# Patient Record
Sex: Male | Born: 1990 | Race: White | Hispanic: No | Marital: Single | Smoking: Current every day smoker
Health system: Southern US, Community
[De-identification: ages and names within clinical notes are randomized; demographics above are authoritative.]

## PROBLEM LIST (undated history)

## (undated) DIAGNOSIS — F319 Bipolar disorder, unspecified: Secondary | ICD-10-CM

## (undated) DIAGNOSIS — F419 Anxiety disorder, unspecified: Secondary | ICD-10-CM

## (undated) DIAGNOSIS — F32A Depression, unspecified: Secondary | ICD-10-CM

## (undated) DIAGNOSIS — F988 Other specified behavioral and emotional disorders with onset usually occurring in childhood and adolescence: Secondary | ICD-10-CM

## (undated) HISTORY — DX: Bipolar disorder, unspecified: F31.9

## (undated) HISTORY — DX: Depression, unspecified: F32.A

## (undated) HISTORY — DX: Anxiety disorder, unspecified: F41.9

---
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
## 2010-06-20 ENCOUNTER — Emergency Department (HOSPITAL_COMMUNITY): Admission: EM | Admit: 2010-06-20 | Discharge: 2010-06-20 | Payer: Self-pay | Admitting: Emergency Medicine

## 2010-09-09 ENCOUNTER — Emergency Department (HOSPITAL_COMMUNITY): Admission: EM | Admit: 2010-09-09 | Discharge: 2010-09-09 | Payer: Self-pay | Admitting: Emergency Medicine

## 2010-12-16 NOTE — Progress Notes (Signed)
Summary: Rx Req  Phone Note Refill Request Call back at Home Phone [phone] Message from:  Patient  Refills Requested: Medication #1:  TETRACYCLINE HCL 250 MG  CAPS two times a day  Medication #2:  DIFFERIN 0.1 %  GEL apply at bedtime after wahing face Pt uses [facility]. Can we please let them know it has been called in.  Initial call taken by: Clydell Hakim,  November 17, 2009 9:32 AM  Follow-up for Phone Call        to pcp Follow-up by: Golden Circle RN,  November 17, 2009 10:07 AM    Prescriptions: DIFFERIN 0.1 %  GEL (ADAPALENE) apply at bedtime after wahing face, 60 gm tube  #1 x 1   Entered and Authorized by:   Helane Rima DO   Signed by:   Helane Rima DO on 11/17/2009   Method used:   Electronically to        [facility] (retail)       [address]       [place], [place]  [id number]       Ph: [phone]       Fax: [phone]   RxID:   [id number] TETRACYCLINE HCL 250 MG  CAPS (TETRACYCLINE HCL) two times a day  #60 x 0   Entered and Authorized by:   Helane Rima DO   Signed by:   Helane Rima DO on 11/17/2009   Method used:   Electronically to        [facility] (retail)       [address]       [place], [place]  [id number]       Ph: [phone]       Fax: [phone]   RxID:   [id number]

## 2010-12-16 NOTE — Progress Notes (Signed)
Summary: refill  Phone Note Refill Request Call back at Home Phone [phone] Message from:  Patient  Refills Requested: Medication #1:  TETRACYCLINE HCL 250 MG  CAPS two times a day  Medication #2:  DIFFERIN 0.1 %  GEL apply at bedtime after wahing face Initial call taken by: De Nurse,  December 23, 2009 12:14 PM  Follow-up for Phone Call        to pcp Follow-up by: Golden Circle RN,  December 23, 2009 12:15 PM    Prescriptions: DIFFERIN 0.1 %  GEL (ADAPALENE) apply at bedtime after wahing face, 60 gm tube  #1 x 1   Entered and Authorized by:   Helane Rima DO   Signed by:   Helane Rima DO on 12/23/2009   Method used:   Electronically to        [facility] (retail)       [address]       [place], [place]  [id number]       Ph: [phone]       Fax: [phone]   RxID:   [id number] TETRACYCLINE HCL 250 MG  CAPS (TETRACYCLINE HCL) two times a day  #60 x 0   Entered and Authorized by:   Helane Rima DO   Signed by:   Helane Rima DO on 12/23/2009   Method used:   Electronically to        [facility] (retail)       [address]       [place], [place]  [id number]       Ph: [phone]       Fax: [phone]   RxID:   [id number]

## 2010-12-21 ENCOUNTER — Encounter: Payer: Self-pay | Admitting: *Deleted

## 2012-03-17 ENCOUNTER — Emergency Department (HOSPITAL_COMMUNITY)
Admission: EM | Admit: 2012-03-17 | Discharge: 2012-03-17 | Disposition: A | Discharge status: Home / Self Care | Payer: Self-pay | Attending: Emergency Medicine | Admitting: Emergency Medicine

## 2012-03-17 ENCOUNTER — Encounter (HOSPITAL_COMMUNITY): Payer: Self-pay | Admitting: *Deleted

## 2012-03-17 DIAGNOSIS — R369 Urethral discharge, unspecified: Secondary | ICD-10-CM | POA: Insufficient documentation

## 2012-03-17 DIAGNOSIS — Z202 Contact with and (suspected) exposure to infections with a predominantly sexual mode of transmission: Secondary | ICD-10-CM | POA: Insufficient documentation

## 2012-03-17 MED ORDER — CEFTRIAXONE SODIUM 250 MG IJ SOLR
250.0000 mg | Freq: Once | INTRAMUSCULAR | Status: AC
  Administered 2012-03-17: 250 mg via INTRAMUSCULAR
  Filled 2012-03-17: qty 250

## 2012-03-17 MED ORDER — AZITHROMYCIN 250 MG PO TABS
1000.0000 mg | ORAL_TABLET | Freq: Once | ORAL | Status: AC
  Administered 2012-03-17: 1000 mg via ORAL
  Filled 2012-03-17: qty 4

## 2012-03-17 MED ORDER — LIDOCAINE HCL (PF) 1 % IJ SOLN
INTRAMUSCULAR | Status: AC
  Administered 2012-03-17: 5 mL
  Filled 2012-03-17: qty 5

## 2012-03-17 NOTE — ED Notes (Signed)
Here with concern for STD, was told he needs to be checked, wants to be checked.  Denies any sx.

## 2012-03-17 NOTE — ED Provider Notes (Signed)
History     CSN: [id number]  Arrival date & time 03/17/12  0554   First MD Initiated Contact with Patient 03/17/12 [phone]      No chief complaint on file.   (Consider location/radiation/quality/duration/timing/severity/associated sxs/prior treatment) HPI  Patient presents to ER with concern of STD exposure. He states he is sexually active with more than one partner though he states he uses protection. Patient states he has been concerned about the color of his urine being yellow though he denies dysuria or hematuria. He denies abdominal pain, n/v, testicular pain or swelling, penile d/c, or genital lesions or rash. When asked why he is concerned about STD exposure he states "I just am" though he denies known STD contacts. He denies fevers or chills. He states he has no known medical problems and takes no meds on regular basis.   History reviewed. No pertinent past medical history.  History reviewed. No pertinent past surgical history.  Family History  Problem Relation Age of Onset  . Cancer Mother     History  Substance Use Topics  . Smoking status: Current Everyday Smoker -- 0.5 packs/day  . Smokeless tobacco: Not on file  . Alcohol Use: No      Review of Systems  Allergies  Review of patient's allergies indicates no known allergies.  Home Medications  No current outpatient prescriptions on file.  BP 121/68  Pulse 81  Temp(Src) 98.2 F (36.8 C) (Oral)  Resp 18  SpO2 97%  Physical Exam  Nursing note and vitals reviewed. Constitutional: He is oriented to person, place, and time. He appears well-developed and well-nourished.  HENT:  Head: Normocephalic and atraumatic.  Neck: Normal range of motion. Neck supple.  Cardiovascular: Normal rate.   Pulmonary/Chest: Effort normal.  Abdominal: Soft. Bowel sounds are normal. He exhibits no distension and no mass. There is no tenderness. There is no rebound and no guarding.  Genitourinary: Penis normal. No penile  tenderness.       Penile discharge. No testicular tenderness to palpation. Testes descended bilaterally.  uncircumcised with foreskin easily slid back and then returned with penile discharge beneath foreskin.   Musculoskeletal: Normal range of motion.  Lymphadenopathy:    He has no cervical adenopathy.       Right: No inguinal adenopathy present.       Left: No inguinal adenopathy present.  Neurological: He is alert and oriented to person, place, and time.  Skin: Skin is warm and dry. No rash noted.  Psychiatric: He has a normal mood and affect.    ED Course  Procedures (including critical care time)  IM rocephin and PO zithromax   Labs Reviewed  GC/CHLAMYDIA PROBE AMP, GENITAL   No results found.   1. Penile discharge     MDM  Young healthy male with positive penile discharge but no testicular tenderness to palpation, abdominal pain or penile lesions. Discussed at length with patient the need for followup with [facility] health STD clinic for HIV screening. Patient prophylactically treated for gonorrhea and chlamydia.         Lenon Oms [place], [place] 03/17/12 [phone]

## 2012-03-17 NOTE — Discharge Instructions (Signed)
You have been treated for gonorrhea and chlamydia in the ER, the two most common sexually transmitted diseases but you should follow up with the [facility] Department for future concerns of sexually transmitted diseases and for HIV screening. This is the recommendation from the [facility] for anyone who has been sexually active with more than one partner.

## 2012-03-18 NOTE — ED Provider Notes (Signed)
Medical screening examination/treatment/procedure(s) were performed by non-physician practitioner and as supervising physician I was immediately available for consultation/collaboration.   Shelda Jakes, MD 03/18/12 [phone]

## 2012-03-20 LAB — GC/CHLAMYDIA PROBE AMP, GENITAL
Chlamydia, DNA Probe: POSITIVE — AB
GC Probe Amp, Genital: POSITIVE — AB

## 2012-03-23 NOTE — ED Notes (Signed)
+  Gonorrhea & +Chlamydia. Patient treated with Rocephin and Zithromax. [facility] faxed x2

## 2012-03-24 NOTE — ED Notes (Signed)
Attempted to call patient. No answer. Left voicemail.

## 2012-03-24 NOTE — ED Notes (Signed)
Patient called back and was informed of + result.

## 2012-07-24 ENCOUNTER — Encounter (HOSPITAL_COMMUNITY): Payer: Self-pay

## 2012-07-24 DIAGNOSIS — R51 Headache: Secondary | ICD-10-CM | POA: Insufficient documentation

## 2012-07-24 DIAGNOSIS — M25569 Pain in unspecified knee: Secondary | ICD-10-CM | POA: Insufficient documentation

## 2012-07-24 NOTE — ED Notes (Signed)
Pt reports headache and (L) knee pain for several days, denies any other symptoms. Denies any injury. Pt a/o x4, nad. Pt states "I think my hat is giving me a headache."

## 2012-07-25 ENCOUNTER — Emergency Department (HOSPITAL_COMMUNITY)
Admission: EM | Admit: 2012-07-25 | Discharge: 2012-07-25 | Discharge status: Against Medical Advice | Payer: Self-pay | Attending: Emergency Medicine | Admitting: Emergency Medicine

## 2012-07-25 NOTE — ED Notes (Signed)
Pt. Called for in waiting room on 3 different occasion. Pt. LWBS.

## 2012-10-09 ENCOUNTER — Encounter (HOSPITAL_COMMUNITY): Payer: Self-pay | Admitting: Emergency Medicine

## 2012-10-09 ENCOUNTER — Emergency Department (HOSPITAL_COMMUNITY)
Admission: EM | Admit: 2012-10-09 | Discharge: 2012-10-10 | Discharge status: Against Medical Advice | Payer: Self-pay | Attending: Emergency Medicine | Admitting: Emergency Medicine

## 2012-10-09 DIAGNOSIS — R109 Unspecified abdominal pain: Secondary | ICD-10-CM | POA: Insufficient documentation

## 2012-10-09 DIAGNOSIS — R1110 Vomiting, unspecified: Secondary | ICD-10-CM | POA: Insufficient documentation

## 2012-10-09 DIAGNOSIS — F17200 Nicotine dependence, unspecified, uncomplicated: Secondary | ICD-10-CM | POA: Insufficient documentation

## 2012-10-09 NOTE — ED Notes (Signed)
Pt in c/o episode of abd pain and n/v after eating this evening. Pt states he thinks it was something he ate. Pt denies pain at this time and states he feels better but wanted to get checked out. Pt symptoms occurred approx 2 hours ago.

## 2012-10-09 NOTE — ED Notes (Signed)
PT. REPORTS MID ABDOMINAL PAIN ONSET THIS EVENING WITH VOMITTING , DENIES FEVER OR DIARRHEA.

## 2012-10-10 LAB — GC/CHLAMYDIA PROBE AMP, URINE
Chlamydia, Swab/Urine, PCR: NEGATIVE
GC Probe Amp, Urine: NEGATIVE

## 2012-10-10 LAB — URINALYSIS, ROUTINE W REFLEX MICROSCOPIC
Bilirubin Urine: NEGATIVE
Nitrite: NEGATIVE
Protein, ur: NEGATIVE mg/dL
Urobilinogen, UA: 0.2 mg/dL (ref 0.0–1.0)

## 2012-10-10 NOTE — ED Provider Notes (Signed)
Medical screening examination/treatment/procedure(s) were performed by non-physician practitioner and as supervising physician I was immediately available for consultation/collaboration.  Juliet Rude. Rubin Payor, MD 10/10/12 [phone]

## 2012-10-10 NOTE — ED Provider Notes (Signed)
History     CSN: [id number]  Arrival date & time 10/09/12  2307   First MD Initiated Contact with Patient 10/09/12 2341      Chief Complaint  Patient presents with  . Abdominal Pain    (Consider location/radiation/quality/duration/timing/severity/associated sxs/prior treatment) Patient is a 21 y.o. male presenting with abdominal pain. The history is provided by the patient. No language interpreter was used.  Abdominal Pain The primary symptoms of the illness include abdominal pain. The current episode started less than 1 hour ago. The onset of the illness was sudden. The problem has been resolved.  Symptoms associated with the illness do not include chills.  Pt reports he had abdominal pain that has resolved.    History reviewed. No pertinent past medical history.  History reviewed. No pertinent past surgical history.  Family History  Problem Relation Age of Onset  . Cancer Mother     History  Substance Use Topics  . Smoking status: Current Every Day Smoker -- 0.5 packs/day  . Smokeless tobacco: Not on file  . Alcohol Use: No      Review of Systems  Constitutional: Negative for chills.  Gastrointestinal: Positive for abdominal pain.  All other systems reviewed and are negative.    Allergies  Review of patient's allergies indicates no known allergies.  Home Medications   Current Outpatient Rx  Name  Route  Sig  Dispense  Refill  . IBUPROFEN 200 MG PO TABS   Oral   Take 400 mg by mouth every 6 (six) hours as needed. For pain           BP 113/63  Temp 97.7 F (36.5 C) (Oral)  Resp 18  SpO2 97%  Physical Exam  Nursing note and vitals reviewed. Constitutional: He is oriented to person, place, and time.  HENT:  Head: Normocephalic and atraumatic.  Right Ear: External ear normal.  Left Ear: External ear normal.  Nose: Nose normal.  Mouth/Throat: Oropharynx is clear and moist.  Eyes: Conjunctivae normal and EOM are normal. Pupils are equal, round,  and reactive to light.  Neck: Normal range of motion. Neck supple.  Cardiovascular: Normal rate and normal heart sounds.   Pulmonary/Chest: Effort normal and breath sounds normal.  Abdominal: Soft. Bowel sounds are normal.  Musculoskeletal: Normal range of motion.  Neurological: He is alert and oriented to person, place, and time. He has normal reflexes.  Skin: Skin is warm.  Psychiatric: He has a normal mood and affect.    ED Course  Procedures (including critical care time)   Labs Reviewed  URINALYSIS, ROUTINE W REFLEX MICROSCOPIC  GC/CHLAMYDIA PROBE AMP   No results found.   No diagnosis found.    MDM     Pt left ama before urine.   Pt told rn possible std risk but pt did not stay for evaluation.     Lonia Skinner [place], [place] 10/10/12 [phone]

## 2012-10-10 NOTE — ED Notes (Addendum)
Pt at desk stating he needed to go because he had to work early. Pt denies pain, denies complaints, states he may come back. Pt ambulatory to door.

## 2012-10-20 ENCOUNTER — Emergency Department (HOSPITAL_COMMUNITY)
Admission: EM | Admit: 2012-10-20 | Discharge: 2012-10-20 | Disposition: A | Discharge status: Home / Self Care | Payer: Self-pay | Attending: Emergency Medicine | Admitting: Emergency Medicine

## 2012-10-20 ENCOUNTER — Encounter (HOSPITAL_COMMUNITY): Payer: Self-pay | Admitting: *Deleted

## 2012-10-20 DIAGNOSIS — F17200 Nicotine dependence, unspecified, uncomplicated: Secondary | ICD-10-CM | POA: Insufficient documentation

## 2012-10-20 DIAGNOSIS — R1013 Epigastric pain: Secondary | ICD-10-CM | POA: Insufficient documentation

## 2012-10-20 DIAGNOSIS — R1110 Vomiting, unspecified: Secondary | ICD-10-CM | POA: Insufficient documentation

## 2012-10-20 NOTE — ED Notes (Signed)
Pt sts eating burrito and dip last night and having severe abd pain and vomiting once. Pt currently denies pain and sts "I feel better now so I am going to leave". tatayana PA notified.

## 2012-10-20 NOTE — ED Notes (Signed)
The pt reports that he will probably leave if he feels better

## 2012-10-20 NOTE — ED Notes (Signed)
The pt has had abd pain since 1800 and he vomited once.  He thinks he ate too much last pm

## 2012-10-20 NOTE — ED Provider Notes (Signed)
History     CSN: [id number]  Arrival date & time 10/20/12  1927   First MD Initiated Contact with Patient 10/20/12 2114      Chief Complaint  Patient presents with  . Abdominal Pain    (Consider location/radiation/quality/duration/timing/severity/associated sxs/prior treatment) HPI PADRAIG NHAN is a 21 y.o. male who presents to ER complaining of abdominal pain. Pt states he ate a burrito earlier and some chips  And hot salsa. Shortly after developed epigastric pain. States came  In here. States hx of the same. States 5 min after signing in, his pain resolved. Pt states " i feel well, i would like to be discharged now." Pt denied any nausea, vomiting, changes in bowels.   History reviewed. No pertinent past medical history.  History reviewed. No pertinent past surgical history.  Family History  Problem Relation Age of Onset  . Cancer Mother     History  Substance Use Topics  . Smoking status: Current Every Day Smoker -- 0.5 packs/day  . Smokeless tobacco: Not on file  . Alcohol Use: No      Review of Systems  Constitutional: Negative for fever and chills.  Respiratory: Negative.   Cardiovascular: Negative.   Gastrointestinal: Positive for abdominal pain. Negative for nausea, vomiting and diarrhea.  Genitourinary: Negative for dysuria, flank pain and testicular pain.  Musculoskeletal: Negative for back pain.  Skin: Negative.   Neurological: Negative for dizziness, weakness and headaches.    Allergies  Review of patient's allergies indicates no known allergies.  Home Medications   Current Outpatient Rx  Name  Route  Sig  Dispense  Refill  . IBUPROFEN 200 MG PO TABS   Oral   Take 400 mg by mouth every 6 (six) hours as needed. For pain           BP 116/63  Pulse 66  Temp 98.8 F (37.1 C) (Oral)  Resp 18  SpO2 99%  Physical Exam  Nursing note and vitals reviewed. Constitutional: He appears well-developed and well-nourished. No distress.  Eyes:  Conjunctivae normal are normal.  Neck: Neck supple.  Cardiovascular: Normal rate, regular rhythm and normal heart sounds.   Pulmonary/Chest: Effort normal and breath sounds normal. No respiratory distress. He has no wheezes. He has no rales.  Abdominal: Soft. Bowel sounds are normal. He exhibits no distension. There is no tenderness. There is no rebound and no guarding.  Musculoskeletal: He exhibits no edema.  Skin: Skin is warm and dry.    ED Course  Procedures (including critical care time)  Labs Reviewed - No data to display No results found.   1. Epigastric pain       MDM  Pt has normal exam. Abdomen soft, non tender. VS normal. Pt requesting discharge stating his pain is gone. Suspect GERD vs gall bladder given presentation, and the fact that onset was after eating. Pt instructed to follow up outpatient. VS stable for d/c home.   Filed Vitals:   10/20/12 2134  BP: 108/43  Pulse: 60  Temp: 98.7 F (37.1 C)  Resp:            Lottie Mussel, [place] 10/21/12 2337

## 2012-10-25 NOTE — ED Provider Notes (Signed)
Medical screening examination/treatment/procedure(s) were performed by non-physician practitioner and as supervising physician I was immediately available for consultation/collaboration.  Kelsei Defino, MD 10/25/12 0131 

## 2013-01-25 ENCOUNTER — Emergency Department (HOSPITAL_COMMUNITY)
Admission: EM | Admit: 2013-01-25 | Discharge: 2013-01-25 | Discharge status: Against Medical Advice | Payer: Self-pay | Attending: Emergency Medicine | Admitting: Emergency Medicine

## 2013-01-25 ENCOUNTER — Encounter (HOSPITAL_COMMUNITY): Payer: Self-pay | Admitting: Emergency Medicine

## 2013-01-25 DIAGNOSIS — F17200 Nicotine dependence, unspecified, uncomplicated: Secondary | ICD-10-CM | POA: Insufficient documentation

## 2013-01-25 DIAGNOSIS — M79609 Pain in unspecified limb: Secondary | ICD-10-CM | POA: Insufficient documentation

## 2013-01-25 NOTE — ED Notes (Signed)
Called pt in waiting room for triage- no answer

## 2013-01-25 NOTE — ED Notes (Signed)
Pt. States, "my foot is fine; I AM FINE. Pt. Demonstrated understanding of risks and benefits.

## 2013-01-25 NOTE — ED Notes (Signed)
L great toe pain x 2 days.  States he believes he wore shoes that were too small.  Denies injury.

## 2013-06-22 ENCOUNTER — Emergency Department (HOSPITAL_COMMUNITY)
Admission: EM | Admit: 2013-06-22 | Discharge: 2013-06-22 | Disposition: A | Discharge status: Home / Self Care | Payer: Self-pay | Attending: Emergency Medicine | Admitting: Emergency Medicine

## 2013-06-22 ENCOUNTER — Encounter (HOSPITAL_COMMUNITY): Payer: Self-pay | Admitting: Emergency Medicine

## 2013-06-22 DIAGNOSIS — K089 Disorder of teeth and supporting structures, unspecified: Secondary | ICD-10-CM | POA: Insufficient documentation

## 2013-06-22 DIAGNOSIS — K0889 Other specified disorders of teeth and supporting structures: Secondary | ICD-10-CM

## 2013-06-22 DIAGNOSIS — F17200 Nicotine dependence, unspecified, uncomplicated: Secondary | ICD-10-CM | POA: Insufficient documentation

## 2013-06-22 MED ORDER — TRAMADOL HCL 50 MG PO TABS: 50.0000 mg | ORAL_TABLET | Freq: Four times a day (QID) | ORAL | Status: DC | PRN

## 2013-06-22 MED ORDER — PENICILLIN V POTASSIUM 500 MG PO TABS: 500.0000 mg | ORAL_TABLET | Freq: Three times a day (TID) | ORAL | Status: DC

## 2013-06-22 NOTE — ED Notes (Signed)
Per ems-- pt reports toothache x 3 days.

## 2013-06-22 NOTE — ED Provider Notes (Signed)
CSN: [id number]     Arrival date & time 06/22/13  0547 History     First MD Initiated Contact with Patient 06/22/13 0559     Chief Complaint  Patient presents with  . Dental Pain   (Consider location/radiation/quality/duration/timing/severity/associated sxs/prior Treatment) HPI  22 year old male brought in via EMS with complaints of toothache. Patient reports gradual onset of pain to the right upper tooth ongoing for the past 3 days. Pain is a sharp and throbbing sensation radiates up towards his right side of face and persistent. Pain is worsened with chewing. Nothing seems to make it better. He has tried taking ibuprofen with minimal relief. No fever, chills, throat swelling, runny nose, cough sneeze, neck pain, or rash. Denies any recent trauma. Patient does not have a dentist. Patient is a smoker.  History reviewed. No pertinent past medical history. History reviewed. No pertinent past surgical history. Family History  Problem Relation Age of Onset  . Cancer Mother    History  Substance Use Topics  . Smoking status: Current Every Day Smoker -- 0.50 packs/day  . Smokeless tobacco: Not on file  . Alcohol Use: Yes    Review of Systems  Constitutional: Negative for fever.  HENT: Positive for dental problem.   Skin: Negative for rash.    Allergies  Review of patient's allergies indicates no known allergies.  Home Medications   Current Outpatient Rx  Name  Route  Sig  Dispense  Refill  . ibuprofen (ADVIL,MOTRIN) 200 MG tablet   Oral   Take 400 mg by mouth every 6 (six) hours as needed. For pain          BP 123/76  Pulse 65  Temp(Src) 97.9 F (36.6 C) (Oral)  Resp 16  SpO2 98% Physical Exam  Nursing note and vitals reviewed. Constitutional: He appears well-developed and well-nourished. No distress.  HENT:  Head: Atraumatic.  Patient with poor dentition. Mild decay noted to right upper canine with moderate tenderness and surrounding gingivitis.  No abscess noted.     No trismus  Eyes: Conjunctivae are normal.  Neck: Neck supple.  Neurological: He is alert.  Skin: No rash noted.    ED Course   NERVE BLOCK Date/Time: 06/22/2013 6:23 AM Performed by: Fayrene Helper Authorized by: Fayrene Helper Consent: Verbal consent obtained. Risks and benefits: risks, benefits and alternatives were discussed Consent given by: patient Patient understanding: patient states understanding of the procedure being performed Patient consent: the patient's understanding of the procedure matches consent given Patient identity confirmed: verbally with patient and arm band Time out: Immediately prior to procedure a "time out" was called to verify the correct patient, procedure, equipment, support staff and site/side marked as required. Indications: pain relief Body area: face/mouth Nerve: middle superior alveolar Laterality: right Patient sedated: no Patient position: sitting Needle gauge: 27 G Location technique: anatomical landmarks Local anesthetic: bupivacaine 0.25% with epinephrine Anesthetic total: 1 ml Outcome: pain improved Patient tolerance: Patient tolerated the procedure well with no immediate complications.   (including critical care time)  6:23 AM Pt with dental pain.  Pt request dental block.  Pt acknowledge benefit/risk of dental block.  Pt received immediate relief with dental block.  Is afebrile.  Agrees to f/u with dentist.  abx and pain medication prescribed.      Labs Reviewed - No data to display No results found. 1. Pain, dental     MDM  BP 123/76  Pulse 65  Temp(Src) 97.9 F (36.6 C) (Oral)  Resp 16  SpO2 98%   Fayrene Helper, PA-C 06/22/13 2952  Fayrene Helper, PA-C 06/22/13 [phone]

## 2013-06-22 NOTE — ED Provider Notes (Signed)
Medical screening examination/treatment/procedure(s) were performed by non-physician practitioner and as supervising physician I was immediately available for consultation/collaboration.  Jones Skene, M.D.     Jones Skene, MD 06/22/13 [phone]

## 2014-06-24 ENCOUNTER — Encounter (HOSPITAL_COMMUNITY): Payer: Self-pay | Admitting: Emergency Medicine

## 2014-06-24 ENCOUNTER — Emergency Department (HOSPITAL_COMMUNITY)
Admission: EM | Admit: 2014-06-24 | Discharge: 2014-06-24 | Disposition: A | Discharge status: Home / Self Care | Payer: Self-pay | Attending: Emergency Medicine | Admitting: Emergency Medicine

## 2014-06-24 DIAGNOSIS — Z792 Long term (current) use of antibiotics: Secondary | ICD-10-CM | POA: Insufficient documentation

## 2014-06-24 DIAGNOSIS — R112 Nausea with vomiting, unspecified: Secondary | ICD-10-CM | POA: Insufficient documentation

## 2014-06-24 DIAGNOSIS — R109 Unspecified abdominal pain: Secondary | ICD-10-CM

## 2014-06-24 DIAGNOSIS — F17200 Nicotine dependence, unspecified, uncomplicated: Secondary | ICD-10-CM | POA: Insufficient documentation

## 2014-06-24 DIAGNOSIS — R1031 Right lower quadrant pain: Secondary | ICD-10-CM | POA: Insufficient documentation

## 2014-06-24 LAB — COMPREHENSIVE METABOLIC PANEL
ALBUMIN: 4.5 g/dL (ref 3.5–5.2)
ALT: 34 U/L (ref 0–53)
ANION GAP: 11 (ref 5–15)
AST: 25 U/L (ref 0–37)
Alkaline Phosphatase: 102 U/L (ref 39–117)
BUN: 11 mg/dL (ref 6–23)
CALCIUM: 9.2 mg/dL (ref 8.4–10.5)
CO2: 24 meq/L (ref 19–32)
CREATININE: 0.94 mg/dL (ref 0.50–1.35)
Chloride: 104 mEq/L (ref 96–112)
GFR calc Af Amer: >90 mL/min (ref >90)
Glucose, Bld: 107 mg/dL — ABNORMAL HIGH (ref 70–99)
Potassium: 4 mEq/L (ref 3.7–5.3)
Sodium: 139 mEq/L (ref 137–147)
Total Bilirubin: 0.5 mg/dL (ref 0.3–1.2)
Total Protein: 7.1 g/dL (ref 6.0–8.3)

## 2014-06-24 LAB — CBC WITH DIFFERENTIAL/PLATELET
BASOS ABS: 0.1 10*3/uL (ref 0.0–0.1)
Basophils Relative: 1 % (ref 0–1)
Eosinophils Absolute: 0.1 10*3/uL (ref 0.0–0.7)
Eosinophils Relative: 2 % (ref 0–5)
HCT: 45.6 % (ref 39.0–52.0)
Hemoglobin: 15.7 g/dL (ref 13.0–17.0)
LYMPHS ABS: 2 10*3/uL (ref 0.7–4.0)
Lymphocytes Relative: 29 % (ref 12–46)
MCH: 31.2 pg (ref 26.0–34.0)
MCHC: 34.4 g/dL (ref 30.0–36.0)
MCV: 90.5 fL (ref 78.0–100.0)
MONO ABS: 0.5 10*3/uL (ref 0.1–1.0)
Monocytes Relative: 7 % (ref 3–12)
NEUTROS ABS: 4.2 10*3/uL (ref 1.7–7.7)
Neutrophils Relative %: 61 % (ref 43–77)
PLATELETS: 205 10*3/uL (ref 150–400)
RBC: 5.04 MIL/uL (ref 4.22–5.81)
RDW: 12.6 % (ref 11.5–15.5)
WBC: 6.9 10*3/uL (ref 4.0–10.5)

## 2014-06-24 LAB — URINALYSIS, ROUTINE W REFLEX MICROSCOPIC
BILIRUBIN URINE: NEGATIVE
Glucose, UA: NEGATIVE mg/dL
Hgb urine dipstick: NEGATIVE
KETONES UR: 15 mg/dL — AB
LEUKOCYTES UA: NEGATIVE
NITRITE: NEGATIVE
PH: 7 (ref 5.0–8.0)
Protein, ur: NEGATIVE mg/dL
Specific Gravity, Urine: 1.027 (ref 1.005–1.030)
UROBILINOGEN UA: 1 mg/dL (ref 0.0–1.0)

## 2014-06-24 LAB — LIPASE, BLOOD: LIPASE: 39 U/L (ref 11–59)

## 2014-06-24 MED ORDER — METOCLOPRAMIDE HCL 10 MG PO TABS: 10.0000 mg | ORAL_TABLET | Freq: Four times a day (QID) | ORAL | Status: DC | PRN

## 2014-06-24 MED ORDER — ONDANSETRON 4 MG PO TBDP
8.0000 mg | ORAL_TABLET | Freq: Once | ORAL | Status: AC
  Administered 2014-06-24: 8 mg via ORAL
  Filled 2014-06-24: qty 2

## 2014-06-24 NOTE — ED Notes (Signed)
Pt A&OX4, ambulatory at d/c with steady gait, NAD 

## 2014-06-24 NOTE — Discharge Instructions (Signed)
Return if pain is getting worse.  Abdominal Pain Many things can cause abdominal pain. Usually, abdominal pain is not caused by a disease and will improve without treatment. It can often be observed and treated at home. Your health care provider will do a physical exam and possibly order blood tests and X-rays to help determine the seriousness of your pain. However, in many cases, more time must pass before a clear cause of the pain can be found. Before that point, your health care provider may not know if you need more testing or further treatment. HOME CARE INSTRUCTIONS  Monitor your abdominal pain for any changes. The following actions may help to alleviate any discomfort you are experiencing:  Only take over-the-counter or prescription medicines as directed by your health care provider.  Do not take laxatives unless directed to do so by your health care provider.  Try a clear liquid diet (broth, tea, or water) as directed by your health care provider. Slowly move to a bland diet as tolerated. SEEK MEDICAL CARE IF:  You have unexplained abdominal pain.  You have abdominal pain associated with nausea or diarrhea.  You have pain when you urinate or have a bowel movement.  You experience abdominal pain that wakes you in the night.  You have abdominal pain that is worsened or improved by eating food.  You have abdominal pain that is worsened with eating fatty foods.  You have a fever. SEEK IMMEDIATE MEDICAL CARE IF:   Your pain does not go away within 2 hours.  You keep throwing up (vomiting).  Your pain is felt only in portions of the abdomen, such as the right side or the left lower portion of the abdomen.  You pass bloody or black tarry stools. MAKE SURE YOU:  Understand these instructions.   Will watch your condition.   Will get help right away if you are not doing well or get worse.  Document Released: 08/10/2005 Document Revised: 11/05/2013 Document Reviewed:  07/10/2013 [facility] Patient Information 2015 [place], [place]. This information is not intended to replace advice given to you by your health care provider. Make sure you discuss any questions you have with your health care provider.  Nausea and Vomiting Nausea is a sick feeling that often comes before throwing up (vomiting). Vomiting is a reflex where stomach contents come out of your mouth. Vomiting can cause severe loss of body fluids (dehydration). Children and elderly adults can become dehydrated quickly, especially if they also have diarrhea. Nausea and vomiting are symptoms of a condition or disease. It is important to find the cause of your symptoms. CAUSES   Direct irritation of the stomach lining. This irritation can result from increased acid production (gastroesophageal reflux disease), infection, food poisoning, taking certain medicines (such as nonsteroidal anti-inflammatory drugs), alcohol use, or tobacco use.  Signals from the brain.These signals could be caused by a headache, heat exposure, an inner ear disturbance, increased pressure in the brain from injury, infection, a tumor, or a concussion, pain, emotional stimulus, or metabolic problems.  An obstruction in the gastrointestinal tract (bowel obstruction).  Illnesses such as diabetes, hepatitis, gallbladder problems, appendicitis, kidney problems, cancer, sepsis, atypical symptoms of a heart attack, or eating disorders.  Medical treatments such as chemotherapy and radiation.  Receiving medicine that makes you sleep (general anesthetic) during surgery. DIAGNOSIS Your caregiver may ask for tests to be done if the problems do not improve after a few days. Tests may also be done if symptoms are severe  or if the reason for the nausea and vomiting is not clear. Tests may include:  Urine tests.  Blood tests.  Stool tests.  Cultures (to look for evidence of infection).  X-rays or other imaging studies. Test results can help  your caregiver make decisions about treatment or the need for additional tests. TREATMENT You need to stay well hydrated. Drink frequently but in small amounts.You may wish to drink water, sports drinks, clear broth, or eat frozen ice pops or gelatin dessert to help stay hydrated.When you eat, eating slowly may help prevent nausea.There are also some antinausea medicines that may help prevent nausea. HOME CARE INSTRUCTIONS   Take all medicine as directed by your caregiver.  If you do not have an appetite, do not force yourself to eat. However, you must continue to drink fluids.  If you have an appetite, eat a normal diet unless your caregiver tells you differently.  Eat a variety of complex carbohydrates (rice, wheat, potatoes, bread), lean meats, yogurt, fruits, and vegetables.  Avoid high-fat foods because they are more difficult to digest.  Drink enough water and fluids to keep your urine clear or pale yellow.  If you are dehydrated, ask your caregiver for specific rehydration instructions. Signs of dehydration may include:  Severe thirst.  Dry lips and mouth.  Dizziness.  Dark urine.  Decreasing urine frequency and amount.  Confusion.  Rapid breathing or pulse. SEEK IMMEDIATE MEDICAL CARE IF:   You have blood or brown flecks (like coffee grounds) in your vomit.  You have black or bloody stools.  You have a severe headache or stiff neck.  You are confused.  You have severe abdominal pain.  You have chest pain or trouble breathing.  You do not urinate at least once every 8 hours.  You develop cold or clammy skin.  You continue to vomit for longer than 24 to 48 hours.  You have a fever. MAKE SURE YOU:   Understand these instructions.  Will watch your condition.  Will get help right away if you are not doing well or get worse. Document Released: 10/31/2005 Document Revised: 01/23/2012 Document Reviewed: 03/30/2011 [facility] Patient Information 2015  [place], [place]. This information is not intended to replace advice given to you by your health care provider. Make sure you discuss any questions you have with your health care provider.  Metoclopramide tablets What is this medicine? METOCLOPRAMIDE (met oh kloe PRA mide) is used to treat the symptoms of gastroesophageal reflux disease (GERD) like heartburn. It is also used to treat people with slow emptying of the stomach and intestinal tract. This medicine may be used for other purposes; ask your health care provider or pharmacist if you have questions. COMMON BRAND NAME(S): Reglan What should I tell my health care provider before I take this medicine? They need to know if you have any of these conditions: -breast cancer -depression -diabetes -heart failure -high blood pressure -kidney disease -liver disease -Parkinson's disease or a movement disorder -pheochromocytoma -seizures -stomach obstruction, bleeding, or perforation -an unusual or allergic reaction to metoclopramide, procainamide, sulfites, other medicines, foods, dyes, or preservatives -pregnant or trying to get pregnant -breast-feeding How should I use this medicine? Take this medicine by mouth with a glass of water. Follow the directions on the prescription label. Take this medicine on an empty stomach, about 30 minutes before eating. Take your doses at regular intervals. Do not take your medicine more often than directed. Do not stop taking except on the advice of your  doctor or health care professional. A special MedGuide will be given to you by the pharmacist with each prescription and refill. Be sure to read this information carefully each time. Talk to your pediatrician regarding the use of this medicine in children. Special care may be needed. Overdosage: If you think you have taken too much of this medicine contact a poison control center or emergency room at once. NOTE: This medicine is only for you. Do not share this  medicine with others. What if I miss a dose? If you miss a dose, take it as soon as you can. If it is almost time for your next dose, take only that dose. Do not take double or extra doses. What may interact with this medicine? -acetaminophen -cyclosporine -digoxin -medicines for blood pressure -medicines for diabetes, including insulin -medicines for hay fever and other allergies -medicines for depression, especially an Monoamine Oxidase Inhibitor (MAOI) -medicines for Parkinson's disease, like levodopa -medicines for sleep or for pain -tetracycline This list may not describe all possible interactions. Give your health care provider a list of all the medicines, herbs, non-prescription drugs, or dietary supplements you use. Also tell them if you smoke, drink alcohol, or use illegal drugs. Some items may interact with your medicine. What should I watch for while using this medicine? It may take a few weeks for your stomach condition to start to get better. However, do not take this medicine for longer than 12 weeks. The longer you take this medicine, and the more you take it, the greater your chances are of developing serious side effects. If you are an elderly patient, a male patient, or you have diabetes, you may be at an increased risk for side effects from this medicine. Contact your doctor immediately if you start having movements you cannot control such as lip smacking, rapid movements of the tongue, involuntary or uncontrollable movements of the eyes, head, arms and legs, or muscle twitches and spasms. Patients and their families should watch out for worsening depression or thoughts of suicide. Also watch out for any sudden or severe changes in feelings such as feeling anxious, agitated, panicky, irritable, hostile, aggressive, impulsive, severely restless, overly excited and hyperactive, or not being able to sleep. If this happens, especially at the beginning of treatment or after a change  in dose, call your doctor. Do not treat yourself for high fever. Ask your doctor or health care professional for advice. You may get drowsy or dizzy. Do not drive, use machinery, or do anything that needs mental alertness until you know how this drug affects you. Do not stand or sit up quickly, especially if you are an older patient. This reduces the risk of dizzy or fainting spells. Alcohol can make you more drowsy and dizzy. Avoid alcoholic drinks. What side effects may I notice from receiving this medicine? Side effects that you should report to your doctor or health care professional as soon as possible: -allergic reactions like skin rash, itching or hives, swelling of the face, lips, or tongue -abnormal production of milk in females -breast enlargement in both males and females -change in the way you walk -difficulty moving, speaking or swallowing -drooling, lip smacking, or rapid movements of the tongue -excessive sweating -fever -involuntary or uncontrollable movements of the eyes, head, arms and legs -irregular heartbeat or palpitations -muscle twitches and spasms -unusually weak or tired Side effects that usually do not require medical attention (report to your doctor or health care professional if they continue or are  bothersome): -change in sex drive or performance -depressed mood -diarrhea -difficulty sleeping -headache -menstrual changes -restless or nervous This list may not describe all possible side effects. Call your doctor for medical advice about side effects. You may report side effects to FDA at 1-800-FDA-1088. Where should I keep my medicine? Keep out of the reach of children. Store at room temperature between 20 and 25 degrees C (68 and 77 degrees F). Protect from light. Keep container tightly closed. Throw away any unused medicine after the expiration date. NOTE: This sheet is a summary. It may not cover all possible information. If you have questions about this  medicine, talk to your doctor, pharmacist, or health care provider.  2015, [employer]. (2012-02-28 13:04:38)

## 2014-06-24 NOTE — ED Notes (Signed)
Pt reporting he feels much better and wants to be discharged.

## 2014-06-24 NOTE — ED Notes (Signed)
Pt c/o abdominal pain since 0130 this morning. Pt reports intermittent pain with nausea and very minimal vomiting.

## 2014-06-24 NOTE — ED Provider Notes (Signed)
CSN: [id number]     Arrival date & time 06/24/14  0345 History   First MD Initiated Contact with Patient 06/24/14 0501     Chief Complaint  Patient presents with  . Abdominal Pain     (Consider location/radiation/quality/duration/timing/severity/associated sxs/prior Treatment) Patient is a 23 y.o. male presenting with abdominal pain. The history is provided by the patient.  Abdominal Pain He is complaining of abdominal pain since about 1:30 AM. The pain is associated with nausea and vomiting. He points to the periumbilical area for where it hurts. There is a radiation of pain. He denies fever, chills, sweats. There's been no constipation or diarrhea. He received a dose of ondansetron oral dissolving tablet and states that he feels much better  History reviewed. No pertinent past medical history. History reviewed. No pertinent past surgical history. Family History  Problem Relation Age of Onset  . Cancer Mother    History  Substance Use Topics  . Smoking status: Current Every Day Smoker -- 0.50 packs/day  . Smokeless tobacco: Not on file  . Alcohol Use: Yes    Review of Systems  Gastrointestinal: Positive for abdominal pain.      Allergies  Review of patient's allergies indicates no known allergies.  Home Medications   Prior to Admission medications   Medication Sig Start Date End Date Taking? Authorizing Provider  penicillin v potassium (VEETID) 500 MG tablet Take 1 tablet (500 mg total) by mouth 3 (three) times daily. 06/22/13   Fayrene Helper, PA-C  traMADol (ULTRAM) 50 MG tablet Take 1 tablet (50 mg total) by mouth every 6 (six) hours as needed for pain. 06/22/13   Fayrene Helper, PA-C   BP 129/75  Pulse 73  Temp(Src) 97.5 F (36.4 C) (Oral)  Resp 18  SpO2 98% Physical Exam  Nursing note and vitals reviewed.  23 year old male, resting comfortably and in no acute distress. Vital signs are normal. Oxygen saturation is 98%, which is normal. Head is normocephalic and  atraumatic. PERRLA, EOMI. Oropharynx is clear. Neck is nontender and supple without adenopathy or JVD. Back is nontender and there is no CVA tenderness. Lungs are clear without rales, wheezes, or rhonchi. Chest is nontender. Heart has regular rate and rhythm without murmur. Abdomen is soft, flat, with moderate tenderness in the right lower part of her tenderness is fairly well localized. There is no rebound or guarding. There are no masses or hepatosplenomegaly and peristalsis is normoactive. Extremities have no cyanosis or edema, full range of motion is present. Skin is warm and dry without rash. Neurologic: Mental status is normal, cranial nerves are intact, there are no motor or sensory deficits.  ED Course  Procedures (including critical care time) Labs Review Results for orders placed during the hospital encounter of 06/24/14  CBC WITH DIFFERENTIAL      Result Value Ref Range   WBC 6.9  4.0 - 10.5 K/uL   RBC 5.04  4.22 - 5.81 MIL/uL   Hemoglobin 15.7  13.0 - 17.0 g/dL   HCT 40.9  81.1 - 91.4 %   MCV 90.5  78.0 - 100.0 fL   MCH 31.2  26.0 - 34.0 pg   MCHC 34.4  30.0 - 36.0 g/dL   RDW 78.2  95.6 - 21.3 %   Platelets 205  150 - 400 K/uL   Neutrophils Relative % 61  43 - 77 %   Lymphocytes Relative 29  12 - 46 %   Monocytes Relative 7  3 - 12 %  Eosinophils Relative 2  0 - 5 %   Basophils Relative 1  0 - 1 %   Neutro Abs 4.2  1.7 - 7.7 K/uL   Lymphs Abs 2.0  0.7 - 4.0 K/uL   Monocytes Absolute 0.5  0.1 - 1.0 K/uL   Eosinophils Absolute 0.1  0.0 - 0.7 K/uL   Basophils Absolute 0.1  0.0 - 0.1 K/uL   Smear Review MORPHOLOGY UNREMARKABLE    COMPREHENSIVE METABOLIC PANEL      Result Value Ref Range   Sodium 139  137 - 147 mEq/L   Potassium 4.0  3.7 - 5.3 mEq/L   Chloride 104  96 - 112 mEq/L   CO2 24  19 - 32 mEq/L   Glucose, Bld 107 (*) 70 - 99 mg/dL   BUN 11  6 - 23 mg/dL   Creatinine, Ser 1.610.94  0.50 - 1.35 mg/dL   Calcium 9.2  8.4 - 09.610.5 mg/dL   Total Protein 7.1  6.0 -  8.3 g/dL   Albumin 4.5  3.5 - 5.2 g/dL   AST 25  0 - 37 U/L   ALT 34  0 - 53 U/L   Alkaline Phosphatase 102  39 - 117 U/L   Total Bilirubin 0.5  0.3 - 1.2 mg/dL   GFR calc non Af Amer >90  >90 mL/min   GFR calc Af Amer >90  >90 mL/min   Anion gap 11  5 - 15  LIPASE, BLOOD      Result Value Ref Range   Lipase 39  11 - 59 U/L  URINALYSIS, ROUTINE W REFLEX MICROSCOPIC      Result Value Ref Range   Color, Urine YELLOW  YELLOW   APPearance CLOUDY (*) CLEAR   Specific Gravity, Urine 1.027  1.005 - 1.030   pH 7.0  5.0 - 8.0   Glucose, UA NEGATIVE  NEGATIVE mg/dL   Hgb urine dipstick NEGATIVE  NEGATIVE   Bilirubin Urine NEGATIVE  NEGATIVE   Ketones, ur 15 (*) NEGATIVE mg/dL   Protein, ur NEGATIVE  NEGATIVE mg/dL   Urobilinogen, UA 1.0  0.0 - 1.0 mg/dL   Nitrite NEGATIVE  NEGATIVE   Leukocytes, UA NEGATIVE  NEGATIVE   MDM   Final diagnoses:  Abdominal pain, unspecified abdominal location  Non-intractable vomiting with nausea, vomiting of unspecified type    Abdominal pain which patient states has resolved but he has persistent right lower quadrant tenderness. I was concerned and wish to order CT scan the patient was concerned about cost. It was decided to wait for lab results to come back. WBC has come back normal without any left shift. Patient was reexamined and there was no tenderness to the abdomen at this point. It was felt that it was safe to discharge him with instructions to return should his pain recur. He is discharged with a prescription for metoclopramide.    Dione Boozeavid Darice Vicario, MD 06/24/14 [phone]

## 2014-10-20 ENCOUNTER — Emergency Department (HOSPITAL_COMMUNITY)
Admission: EM | Admit: 2014-10-20 | Discharge: 2014-10-20 | Disposition: A | Discharge status: Home / Self Care | Payer: Self-pay | Attending: Emergency Medicine | Admitting: Emergency Medicine

## 2014-10-20 ENCOUNTER — Encounter (HOSPITAL_COMMUNITY): Payer: Self-pay | Admitting: Emergency Medicine

## 2014-10-20 DIAGNOSIS — Z8659 Personal history of other mental and behavioral disorders: Secondary | ICD-10-CM | POA: Insufficient documentation

## 2014-10-20 DIAGNOSIS — R197 Diarrhea, unspecified: Secondary | ICD-10-CM | POA: Insufficient documentation

## 2014-10-20 DIAGNOSIS — R110 Nausea: Secondary | ICD-10-CM

## 2014-10-20 DIAGNOSIS — R1012 Left upper quadrant pain: Secondary | ICD-10-CM | POA: Insufficient documentation

## 2014-10-20 DIAGNOSIS — R112 Nausea with vomiting, unspecified: Secondary | ICD-10-CM | POA: Insufficient documentation

## 2014-10-20 DIAGNOSIS — R1084 Generalized abdominal pain: Secondary | ICD-10-CM | POA: Insufficient documentation

## 2014-10-20 DIAGNOSIS — R6883 Chills (without fever): Secondary | ICD-10-CM | POA: Insufficient documentation

## 2014-10-20 DIAGNOSIS — Z720 Tobacco use: Secondary | ICD-10-CM | POA: Insufficient documentation

## 2014-10-20 HISTORY — DX: Other specified behavioral and emotional disorders with onset usually occurring in childhood and adolescence: F98.8

## 2014-10-20 LAB — COMPREHENSIVE METABOLIC PANEL
ALBUMIN: 4.5 g/dL (ref 3.5–5.2)
ALT: 51 U/L (ref 0–53)
ANION GAP: 14 (ref 5–15)
AST: 26 U/L (ref 0–37)
Alkaline Phosphatase: 85 U/L (ref 39–117)
BILIRUBIN TOTAL: 0.6 mg/dL (ref 0.3–1.2)
BUN: 6 mg/dL (ref 6–23)
CALCIUM: 9.6 mg/dL (ref 8.4–10.5)
CHLORIDE: 102 meq/L (ref 96–112)
CO2: 24 mEq/L (ref 19–32)
CREATININE: 0.76 mg/dL (ref 0.50–1.35)
GFR calc Af Amer: >90 mL/min (ref >90)
GFR calc non Af Amer: >90 mL/min (ref >90)
Glucose, Bld: 113 mg/dL — ABNORMAL HIGH (ref 70–99)
Potassium: 4 mEq/L (ref 3.7–5.3)
Sodium: 140 mEq/L (ref 137–147)
TOTAL PROTEIN: 7.1 g/dL (ref 6.0–8.3)

## 2014-10-20 LAB — CBC WITH DIFFERENTIAL/PLATELET
BASOS PCT: 0 % (ref 0–1)
Basophils Absolute: 0 10*3/uL (ref 0.0–0.1)
Eosinophils Absolute: 0.1 10*3/uL (ref 0.0–0.7)
Eosinophils Relative: 1 % (ref 0–5)
HEMATOCRIT: 45 % (ref 39.0–52.0)
HEMOGLOBIN: 16 g/dL (ref 13.0–17.0)
LYMPHS ABS: 1.7 10*3/uL (ref 0.7–4.0)
LYMPHS PCT: 16 % (ref 12–46)
MCH: 31.6 pg (ref 26.0–34.0)
MCHC: 35.6 g/dL (ref 30.0–36.0)
MCV: 88.8 fL (ref 78.0–100.0)
MONO ABS: 0.6 10*3/uL (ref 0.1–1.0)
MONOS PCT: 5 % (ref 3–12)
NEUTROS ABS: 8.4 10*3/uL — AB (ref 1.7–7.7)
NEUTROS PCT: 78 % — AB (ref 43–77)
Platelets: 238 10*3/uL (ref 150–400)
RBC: 5.07 MIL/uL (ref 4.22–5.81)
RDW: 12.9 % (ref 11.5–15.5)
WBC: 10.8 10*3/uL — AB (ref 4.0–10.5)

## 2014-10-20 LAB — LIPASE, BLOOD: LIPASE: 17 U/L (ref 11–59)

## 2014-10-20 MED ORDER — ONDANSETRON 4 MG PO TBDP
4.0000 mg | ORAL_TABLET | Freq: Once | ORAL | Status: AC
  Administered 2014-10-20: 4 mg via ORAL
  Filled 2014-10-20: qty 1

## 2014-10-20 MED ORDER — OXYCODONE-ACETAMINOPHEN 5-325 MG PO TABS: 1.0000 | ORAL_TABLET | Freq: Once | ORAL | Status: DC

## 2014-10-20 MED ORDER — ONDANSETRON 4 MG PO TBDP: ORAL_TABLET | ORAL | Status: DC

## 2014-10-20 NOTE — ED Notes (Signed)
Urinal provided.  Notified of need for urine specimen.

## 2014-10-20 NOTE — Discharge Instructions (Signed)
Abdominal Pain °Many things can cause abdominal pain. Usually, abdominal pain is not caused by a disease and will improve without treatment. It can often be observed and treated at home. Your health care provider will do a physical exam and possibly order blood tests and X-rays to help determine the seriousness of your pain. However, in many cases, more time must pass before a clear cause of the pain can be found. Before that point, your health care provider may not know if you need more testing or further treatment. °HOME CARE INSTRUCTIONS  °Monitor your abdominal pain for any changes. The following actions may help to alleviate any discomfort you are experiencing: °· Only take over-the-counter or prescription medicines as directed by your health care provider. °· Do not take laxatives unless directed to do so by your health care provider. °· Try a clear liquid diet (broth, tea, or water) as directed by your health care provider. Slowly move to a bland diet as tolerated. °SEEK MEDICAL CARE IF: °· You have unexplained abdominal pain. °· You have abdominal pain associated with nausea or diarrhea. °· You have pain when you urinate or have a bowel movement. °· You experience abdominal pain that wakes you in the night. °· You have abdominal pain that is worsened or improved by eating food. °· You have abdominal pain that is worsened with eating fatty foods. °· You have a fever. °SEEK IMMEDIATE MEDICAL CARE IF:  °· Your pain does not go away within 2 hours. °· You keep throwing up (vomiting). °· Your pain is felt only in portions of the abdomen, such as the right side or the left lower portion of the abdomen. °· You pass bloody or black tarry stools. °MAKE SURE YOU: °· Understand these instructions.   °· Will watch your condition.   °· Will get help right away if you are not doing well or get worse.   °Document Released: 08/10/2005 Document Revised: 11/05/2013 Document Reviewed: 07/10/2013 °ExitCare® Patient Information  ©2015 [employer]. This information is not intended to replace advice given to you by your health care provider. Make sure you discuss any questions you have with your health care provider. ° °Nausea and Vomiting °Nausea is a sick feeling that often comes before throwing up (vomiting). Vomiting is a reflex where stomach contents come out of your mouth. Vomiting can cause severe loss of body fluids (dehydration). Children and elderly adults can become dehydrated quickly, especially if they also have diarrhea. Nausea and vomiting are symptoms of a condition or disease. It is important to find the cause of your symptoms. °CAUSES  °· Direct irritation of the stomach lining. This irritation can result from increased acid production (gastroesophageal reflux disease), infection, food poisoning, taking certain medicines (such as nonsteroidal anti-inflammatory drugs), alcohol use, or tobacco use. °· Signals from the brain. These signals could be caused by a headache, heat exposure, an inner ear disturbance, increased pressure in the brain from injury, infection, a tumor, or a concussion, pain, emotional stimulus, or metabolic problems. °· An obstruction in the gastrointestinal tract (bowel obstruction). °· Illnesses such as diabetes, hepatitis, gallbladder problems, appendicitis, kidney problems, cancer, sepsis, atypical symptoms of a heart attack, or eating disorders. °· Medical treatments such as chemotherapy and radiation. °· Receiving medicine that makes you sleep (general anesthetic) during surgery. °DIAGNOSIS °Your caregiver may ask for tests to be done if the problems do not improve after a few days. Tests may also be done if symptoms are severe or if the reason for the nausea   and vomiting is not clear. Tests may include: °· Urine tests. °· Blood tests. °· Stool tests. °· Cultures (to look for evidence of infection). °· X-rays or other imaging studies. °Test results can help your caregiver make decisions about  treatment or the need for additional tests. °TREATMENT °You need to stay well hydrated. Drink frequently but in small amounts. You may wish to drink water, sports drinks, clear broth, or eat frozen ice pops or gelatin dessert to help stay hydrated. When you eat, eating slowly may help prevent nausea. There are also some antinausea medicines that may help prevent nausea. °HOME CARE INSTRUCTIONS  °· Take all medicine as directed by your caregiver. °· If you do not have an appetite, do not force yourself to eat. However, you must continue to drink fluids. °· If you have an appetite, eat a normal diet unless your caregiver tells you differently. °¨ Eat a variety of complex carbohydrates (rice, wheat, potatoes, bread), lean meats, yogurt, fruits, and vegetables. °¨ Avoid high-fat foods because they are more difficult to digest. °· Drink enough water and fluids to keep your urine clear or pale yellow. °· If you are dehydrated, ask your caregiver for specific rehydration instructions. Signs of dehydration may include: °¨ Severe thirst. °¨ Dry lips and mouth. °¨ Dizziness. °¨ Dark urine. °¨ Decreasing urine frequency and amount. °¨ Confusion. °¨ Rapid breathing or pulse. °SEEK IMMEDIATE MEDICAL CARE IF:  °· You have blood or brown flecks (like coffee grounds) in your vomit. °· You have black or bloody stools. °· You have a severe headache or stiff neck. °· You are confused. °· You have severe abdominal pain. °· You have chest pain or trouble breathing. °· You do not urinate at least once every 8 hours. °· You develop cold or clammy skin. °· You continue to vomit for longer than 24 to 48 hours. °· You have a fever. °MAKE SURE YOU:  °· Understand these instructions. °· Will watch your condition. °· Will get help right away if you are not doing well or get worse. °Document Released: 10/31/2005 Document Revised: 01/23/2012 Document Reviewed: 03/30/2011 °ExitCare® Patient Information ©2015 [employer]. This information is not  intended to replace advice given to you by your health care provider. Make sure you discuss any questions you have with your health care provider. ° ° °Emergency Department Resource Guide °1) Find a Doctor and Pay Out of Pocket °Although you won't have to find out who is covered by your insurance plan, it is a good idea to ask around and get recommendations. You will then need to call the office and see if the doctor you have chosen will accept you as a new patient and what types of options they offer for patients who are self-pay. Some doctors offer discounts or will set up payment plans for their patients who do not have insurance, but you will need to ask so you aren't surprised when you get to your appointment. ° °2) Contact Your Local Health Department °Not all health departments have doctors that can see patients for sick visits, but many do, so it is worth a call to see if yours does. If you don't know where your local health department is, you can check in your phone book. The CDC also has a tool to help you locate your state's health department, and many state websites also have listings of all of their local health departments. ° °3) Find a Walk-in Clinic °If your illness is not likely to be very severe   or complicated, you may want to try a walk in clinic. These are popping up all over the country in pharmacies, drugstores, and shopping centers. They're usually staffed by nurse practitioners or physician assistants that have been trained to treat common illnesses and complaints. They're usually fairly quick and inexpensive. However, if you have serious medical issues or chronic medical problems, these are probably not your best option. ° °No Primary Care Doctor: °- Call [facility] at  832-8000 - they can help you locate a primary care doctor that  accepts your insurance, provides certain services, etc. °- Physician Referral Service- 1-800-533-3463 ° °Chronic Pain Problems: °Organization          Address  Phone   Notes  °[facility]  (336) 297-2271 Patients need to be referred by their primary care doctor.  ° °Medication Assistance: °Organization         Address  Phone   Notes  °[place] [facility] [address] °[place], [place] [address] (336) 641-8030 --Must be a resident of [place] °-- Must have NO insurance coverage whatsoever (no Medicaid/ Medicare, etc.) °-- The pt. MUST have a primary care doctor that directs their care regularly and follows them in the community °  °[facility]  (866) 331-1348   °[employer]  (888) 892-1162   ° °Agencies that provide inexpensive medical care: °Organization         Address  Phone   Notes  °[facility]  (336) 832-8035   °[facility]    (336) 832-7272   °[facility] [address] °[place], [place] [address] (336) 832-4777   °[facility] [address], °[place] (336) 271-4999   °[facility]    (336) 373-0678   °[facility]    (336) 272-1050   °[facility] ° [address], [place] Phone:  (336) 832-4444, Fax:  (336) 832-4440 Hours of Operation:  9 am - 6 pm, M-F.  Also accepts Medicaid/Medicare and self-pay.  °[facility] ° [address], [place] Phone: (336) 832-3150, Fax: (336) 832-3151. Hours of Operation:  8:30 am - 5:30 pm, M-F.  Also accepts Medicaid and self-pay.  °[facility] [place] [address], [place] Phone: (336) 878-6027   °[facility] [address], [place], [place] (336)723-1848, Ext. 123 Mondays & Thursdays: 7-9 AM.  First 15 patients are seen on a first come, first serve basis. °  ° °Medicaid-accepting [place] Providers: ° °Organization         Address  Phone   Notes  °[facility] [address], [place] (336) 641-2100 Also accepts self-pay patients.  °[facility] [address], [place] ° (336) 856-9996   °[facility] [address], [place] (336) 288-8857   °[facility] [address] [place] [address], [place] (336) 299-7000   °Veita Bland [address], [place]  ° (336) 373-1557 Only accepts [place] Access Medicaid patients after they have their name applied to their card.  ° °Self-Pay (no insurance) in [place]: ° °Organization         Address  Phone   Notes  °Sickle Cell Patients, [facility] [address], [place] (336) 832-1970   °[facility] [address], [place] (336) 832-4400   °[facility] ° [address], [place] (336) 992-4800   °[facility]/Dr. Osei-Bonsu ° [address] [place] [address], [place] or [address], [place] (336) 841-8500 Phone number for both [place] and [place] locations is the same.  °[facility] [address], [place] (336) 299-0000   °[facility] [address] [place] [address], [place] or [address] (336) 852-7530 °(336) 878-2260   °[facility] [address], [place] (336) 350-1642, phone; (336) 294-5005, fax Sees patients 1st and 3rd Saturday of every month.  Must not qualify for public or private insurance (i.e. Medicaid, Medicare, [place] Health Choice, Veterans' Benefits) • Household income should be no more than 200% of the poverty level •The clinic cannot treat you if you are pregnant or think you are pregnant • Sexually transmitted diseases are not treated at the clinic.  ° ° °Dental Care: °Organization         Address  Phone  Notes  °[facility] [address], [place] (336) 641-6152 Accepts children up to age 21 who are enrolled in Medicaid or [place] Health Choice; pregnant women with a Medicaid card; and  children who have applied for Medicaid or [place] Health Choice, but were declined, whose parents can pay a reduced fee at time of service.  °[facility] [place]  [address], [place] (336) 641-7733 Accepts children up to age 21 who are enrolled in Medicaid or [place] Health Choice; pregnant women with a Medicaid card; and children who have applied for Medicaid or [place] Health Choice, but were declined, whose parents can pay a reduced fee at time of service.  °[facility] ° [address], [place] (336) 641-4533 Patients are seen by appointment only. Walk-ins are not accepted. [facility] will see patients 18 years of age and older. °Monday - Tuesday (8am-5pm) °Most Wednesdays (8:30-5pm) °$30 per visit, cash only  °[facility] ° [address], [place] (336) 641-4533 Patients are seen by appointment only. Walk-ins are not accepted. [facility] will see patients 18 years of age and older. °One Wednesday Evening (Monthly: Volunteer Based).  $30 per visit, cash only  °[facility]  (919) 537-3737 for adults; Children under age 4, call Graduate Pediatric Dentistry at ([phone]) [phone]. Children aged 4-14, please call (919) [phone] to request a pediatric application. ° Dental services are provided in all areas of dental care including fillings, crowns and bridges, complete and partial dentures, implants, gum treatment, root canals, and extractions. Preventive care is also provided. Treatment is provided to both adults and children. °Patients are selected via a lottery and there is often a waiting list. °  °[facility] [address], °[place] ° (336) 763-8833 [email or web address] °  °[facility] [address], [place], [place] (336)723-1848, Ext. 123 Second and Fourth Thursday of each month, opens at 6:30 AM; Clinic ends at 9 AM.  Patients are seen on a first-come first-served  basis, and a limited number are seen during each clinic.  ° °[facility] ° [address], [place],  (336) 723-7904   Eligibility Requirements °You must have lived in [place], [place], or [place] counties for at least the last three months. °  You cannot   be eligible for state or federal sponsored healthcare insurance, including Veterans Administration, Medicaid, or Medicare. °  You generally cannot be eligible for healthcare insurance through your employer.  °  How to apply: °Eligibility screenings are held every Tuesday and Wednesday afternoon from 1:00 pm until 4:00 pm. You do not need an appointment for the interview!  °[facility] [address], [place], [place] 336-631-2330   °[facility]  336-342-8273   °[facility]  336-703-3100   °[facility]  336-570-6415   ° °Behavioral Health Resources in the Community: °Intensive Outpatient Programs °Organization         Address  Phone  Notes  °[facility] [address], [place], [place] 336-878-6098   °[facility] [address], [place], [place] 336-832-9800   °[facility] [address], [place], [place] ° 336-882-2125   °[facility] [address],  °[place], [place] 1-800-853-5163 or 336-641-4981   °Substance Abuse Resources °Organization         Address  Phone  Notes  °[facility]  336-882-2125   °[facility]  336-784-9470   °[facility]  336-285-9073   °[facility]  336-845-3988   °Residential & Outpatient Substance Abuse Program  1-800-659-3381   °Psychological Services °Organization         Address  Phone  Notes  °[facility]  336- 832-9600   °[facility]  336- 378-7881   °[facility] [address], [place] 1-800-853-5163 or 336-641-4981   ° °Mobile Crisis Teams °Organization          Address  Phone  Notes  °[facility], Mobile Crisis Care Unit  1-877-626-1772   °Assertive °[facility] ° [address] [place], [place] 336-834-9664   °Sharon DeEsch [address] °[place] 336-554-5454   ° °Self-Help/Support Groups °Organization         Address  Phone             Notes  °[facility] [place] - variety of support groups  336- 373-1402 Call for more information  °Narcotics Anonymous (NA), [facility] [address], °[place]  2 meetings at this location  ° °Residential Treatment Programs °Organization         Address  Phone  Notes  °[facility] [address],    °[place]  1-866-801-8205   °[facility] ° [address], [place], [place] 704-293-8524   °[facility] [address], [place] 336-845-3988 Admissions: 8am-3pm M-F  °[facility] [address]    °[place], [place] 336-841-1104   °[facility] [address], [place], [place] 336-379-7146   °[facility] [address]  °[place], [place] 336-285-9073   °[facility] - Intensive Outpatient [address], , [place] 336-852-3033   °[facility] [address]  °[place], [place] 1-877-615-2722 or 336-784-9470   °[facility] [address] [place], [place] 336-227-7417 Accepts Medicaid  °[facility] [address]  ° [place] 1-800-659-3381 Substance Abuse/Addiction Treatment  ° °[place] Behavioral Health Resources °Organization         Address  Phone  Notes  °[facility]  (888) 581-9988   °Julie Brannon, PhD [address]   [place], [place]   (336) 349-5553 or (336) 951-0000   ° Behavioral   [address] °[place], [place] (336) 349-4454   °[facility] [address], [place], [place] (336) 342-8316 Insurance/Medicaid/sponsorship  through [facility]  °[facility] [address]                                    [place], [place] (336) 342-8316 Therapy/tele-psych/case  °[facility] [address]  ° [place], [place] (336) 349-2233    °Dr. Arfeen  (336) 349-4544   °[facility] [place]  [employer] [place] [facility] 1) [address], [place] °2) [address], [place] °3)  [address], [place] (336) 349-3220 °(336) 342-7768 ° °(336) 342-8140   °[place] [facility] (336) 342-1394 or (336) 342-3537 (After Hours)    ° ° ° °

## 2014-10-20 NOTE — ED Notes (Signed)
States i feel sick.  When asked to elaborate states nausea and vomiting since this morning.  C/o diarrhea x 2.

## 2014-10-20 NOTE — ED Provider Notes (Signed)
CSN: [id number]     Arrival date & time 10/20/14  0256 History   First MD Initiated Contact with Patient 10/20/14 [phone]     Chief Complaint  Patient presents with  . Nausea     (Consider location/radiation/quality/duration/timing/severity/associated sxs/prior Treatment) Patient is a 23 y.o. male presenting with abdominal pain.  Abdominal Pain Pain location:  LUQ Pain quality: cramping   Pain radiates to:  Does not radiate Pain severity:  Moderate Onset quality:  Gradual Duration:  1 day Timing:  Constant Progression:  Worsening Chronicity:  Recurrent Context: not previous surgeries   Relieved by:  Nothing Worsened by:  Nothing tried Associated symptoms: chills, diarrhea, nausea and vomiting   Associated symptoms: no dysuria and no fever     Past Medical History  Diagnosis Date  . Attention deficit disorder as a child   No past surgical history on file. Family History  Problem Relation Age of Onset  . Cancer Mother    History  Substance Use Topics  . Smoking status: Current Every Day Smoker -- 1.00 packs/day for 5 years    Types: Cigarettes  . Smokeless tobacco: Not on file  . Alcohol Use: 1.8 oz/week    3 Cans of beer per week    Review of Systems  Constitutional: Positive for chills. Negative for fever.  Gastrointestinal: Positive for nausea, vomiting, abdominal pain and diarrhea.  Genitourinary: Negative for dysuria.  All other systems reviewed and are negative.     Allergies  Review of patient's allergies indicates no known allergies.  Home Medications   Prior to Admission medications   Medication Sig Start Date End Date Taking? Authorizing Provider  acetaminophen (TYLENOL) 500 MG tablet Take 1,000 mg by mouth every 6 (six) hours as needed (pain).   Yes Historical Provider, MD  metoCLOPramide (REGLAN) 10 MG tablet Take 1 tablet (10 mg total) by mouth every 6 (six) hours as needed for nausea. Patient not taking: Reported on 10/20/2014 06/24/14   Dione Boozeavid  Glick, MD  ondansetron (ZOFRAN ODT) 4 MG disintegrating tablet 4mg  ODT q4 hours prn nausea/vomit 10/20/14   Mirian MoMatthew Gentry, MD   BP 105/51 mmHg  Pulse 74  Temp(Src) 98 F (36.7 C) (Oral)  Resp 24  Ht 5\' 8"  (1.727 m)  Wt 145 lb (65.772 kg)  BMI 22.05 kg/m2  SpO2 96% Physical Exam  Constitutional: He is oriented to person, place, and time. He appears well-developed and well-nourished.  HENT:  Head: Normocephalic and atraumatic.  Eyes: Conjunctivae and EOM are normal.  Neck: Normal range of motion. Neck supple.  Cardiovascular: Normal rate, regular rhythm and normal heart sounds.   Pulmonary/Chest: Effort normal and breath sounds normal. No respiratory distress.  Abdominal: He exhibits no distension. There is tenderness in the left lower quadrant. There is no rebound and no guarding.  Musculoskeletal: Normal range of motion.  Neurological: He is alert and oriented to person, place, and time.  Skin: Skin is warm and dry.  Vitals reviewed.   ED Course  Procedures (including critical care time) Labs Review Labs Reviewed  COMPREHENSIVE METABOLIC PANEL - Abnormal; Notable for the following:    Glucose, Bld 113 (*)    All other components within normal limits  CBC WITH DIFFERENTIAL - Abnormal; Notable for the following:    WBC 10.8 (*)    Neutrophils Relative % 78 (*)    Neutro Abs 8.4 (*)    All other components within normal limits  LIPASE, BLOOD  URINALYSIS, ROUTINE W REFLEX MICROSCOPIC  Imaging Review No results found.   EKG Interpretation None      MDM   Final diagnoses:  Nausea  Generalized abdominal pain    23 y.o. male without pertinent PMH presents with abdominal pain, nausea, vomiting as described above. Patient also states that his secondary reason for presenting is that he was cold outside because he is homeless. On arrival today vitals signs and physical exam as above. Patient has a history of a recent visit for right lower quadrant pain with  unremarkable workup. Although he describes his pain as in the left upper quadrant, on physical exam the patient has tenderness in his left lower quadrant.  Vitals unremarkable.  Patient was given Zofran with total relief of symptoms. Repeat abdominal examination unremarkable. Labs as above unremarkable with the exception of a very mild leukocytosis. Patient is tolerant of by mouth intake. Given standard return precautions, voiced understanding and agreed to follow-up. Likely early viral gastritis.    1. Nausea   2. Generalized abdominal pain         Mirian MoMatthew Gentry, MD 10/20/14 [phone]

## 2015-08-10 ENCOUNTER — Emergency Department (HOSPITAL_COMMUNITY)
Admission: EM | Admit: 2015-08-10 | Discharge: 2015-08-10 | Disposition: A | Discharge status: Home / Self Care | Payer: Self-pay | Attending: Emergency Medicine | Admitting: Emergency Medicine

## 2015-08-10 ENCOUNTER — Encounter (HOSPITAL_COMMUNITY): Payer: Self-pay | Admitting: Emergency Medicine

## 2015-08-10 DIAGNOSIS — Z202 Contact with and (suspected) exposure to infections with a predominantly sexual mode of transmission: Secondary | ICD-10-CM | POA: Insufficient documentation

## 2015-08-10 DIAGNOSIS — Z79899 Other long term (current) drug therapy: Secondary | ICD-10-CM | POA: Insufficient documentation

## 2015-08-10 DIAGNOSIS — Z711 Person with feared health complaint in whom no diagnosis is made: Secondary | ICD-10-CM

## 2015-08-10 DIAGNOSIS — Z720 Tobacco use: Secondary | ICD-10-CM | POA: Insufficient documentation

## 2015-08-10 DIAGNOSIS — Z8659 Personal history of other mental and behavioral disorders: Secondary | ICD-10-CM | POA: Insufficient documentation

## 2015-08-10 LAB — GC/CHLAMYDIA PROBE AMP (~~LOC~~) NOT AT ARMC
CHLAMYDIA, DNA PROBE: NEGATIVE
NEISSERIA GONORRHEA: NEGATIVE

## 2015-08-10 NOTE — ED Provider Notes (Signed)
CSN: [id number]     Arrival date & time 08/10/15  0335 History   First MD Initiated Contact with Patient 08/10/15 0424     Chief Complaint  Patient presents with  . Exposure to STD     (Consider location/radiation/quality/duration/timing/severity/associated sxs/prior Treatment) Patient is a 24 y.o. male presenting with STD exposure. The history is provided by the patient. No language interpreter was used.  Exposure to STD This is a new problem. The current episode started today. The problem has been unchanged. Pertinent negatives include no abdominal pain, arthralgias, coughing, fatigue, fever, headaches, joint swelling, myalgias, nausea, sore throat, urinary symptoms or visual change. Nothing aggravates the symptoms. He has tried nothing for the symptoms. The treatment provided no relief.    Past Medical History  Diagnosis Date  . 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 No pertinent past surgical history. Family History  Problem Relation Age of Onset  . Cancer Mother    Social History  Substance Use Topics  . Smoking status: Current Every Day Smoker -- 1.00 packs/day for 5 years    Types: Cigarettes  . Smokeless tobacco: None  . Alcohol Use: No    Review of Systems  Constitutional: Negative for fever and fatigue.       STD check  HENT: Negative for sore throat.   Respiratory: Negative for cough.   Gastrointestinal: Negative for nausea and abdominal pain.  Musculoskeletal: Negative for myalgias, joint swelling and arthralgias.  Neurological: Negative for headaches.  All other systems reviewed and are negative.     Allergies  Review of patient's allergies indicates no known allergies.  Home Medications   Prior to Admission medications   Medication Sig Start Date End Date Taking? Authorizing Provider  acetaminophen (TYLENOL) 500 MG tablet Take 1,000 mg by mouth every 6 (six) hours as needed (pain).    Historical Provider, MD  metoCLOPramide  (REGLAN) 10 MG tablet Take 1 tablet (10 mg total) by mouth every 6 (six) hours as needed for nausea. Patient not taking: Reported on 10/20/2014 06/24/14   Dione Booze, MD  ondansetron (ZOFRAN ODT) 4 MG disintegrating tablet  ODT q4 hours prn nausea/vomit 10/20/14   Mirian Mo, MD   BP 126/73 mmHg  Pulse 78  Temp(Src) 98 F (36.7 C) (Oral)  Resp 16  Ht  (1.727 m)  Wt 175 lb (79.379 kg)  BMI 26.61 kg/m2  SpO2 97% Physical Exam  Constitutional: He is oriented to person, place, and time. He appears well-developed and well-nourished. No distress.  HENT:  Head: Normocephalic and atraumatic.  Eyes: Conjunctivae and EOM are normal.  Neck: Normal range of motion.  Cardiovascular: Normal rate and regular rhythm.  Exam reveals no gallop and no friction rub.   No murmur heard. Pulmonary/Chest: Effort normal and breath sounds normal. He has no wheezes. He has no rales. He exhibits no tenderness.  Abdominal: Soft. He exhibits no distension. There is no tenderness. There is no rebound.  Genitourinary:  uncircumcised penis. No discharge at the urethra noted.   Musculoskeletal: Normal range of motion.  Neurological: He is alert and oriented to person, place, and time. Coordination normal.  Speech is goal-oriented. Moves limbs without ataxia.   Skin: Skin is warm and dry.  Psychiatric: He has a normal mood and affect. His behavior is normal.  Nursing note and vitals reviewed.   ED Course  Procedures (including critical care time) Labs Review Labs Reviewed  GC/CHLAMYDIA PROBE AMP ([place]) NOT AT [facility]  Imaging Review No results found. I have personally reviewed and evaluated these images and lab results as part of my medical decision-making.   EKG Interpretation None      MDM   Final diagnoses:  Concern about STD in male without diagnosis    5:19 AM Patient's GC/Chlamydia done and he will be discharged. Patient will be called with results in 2 days.     Emilia Beck, PA-C 08/10/15 7829  Tomasita Crumble, MD 08/10/15 [phone]

## 2015-08-10 NOTE — ED Notes (Signed)
While discussing discharge papers, pt asks if testing today will show HIV/AIDS - this RN explained those tests were not done and offered to speak with EDP to discuss further testing d/t pt concern. Pt declined and stated "No thank you - I just am ready to get out of here." No further question/concerns.

## 2015-08-10 NOTE — ED Notes (Signed)
Patient here with significant other. Encouraged by partner to get checked for STDs. Denies all symptoms and has no complaints aside from amount of time involved in getting checked.

## 2016-09-12 ENCOUNTER — Emergency Department (HOSPITAL_COMMUNITY)
Admission: EM | Admit: 2016-09-12 | Discharge: 2016-09-12 | Disposition: A | Discharge status: Home / Self Care | Payer: Self-pay | Attending: Emergency Medicine | Admitting: Emergency Medicine

## 2016-09-12 ENCOUNTER — Encounter (HOSPITAL_COMMUNITY): Payer: Self-pay

## 2016-09-12 DIAGNOSIS — F909 Attention-deficit hyperactivity disorder, unspecified type: Secondary | ICD-10-CM | POA: Insufficient documentation

## 2016-09-12 DIAGNOSIS — F17210 Nicotine dependence, cigarettes, uncomplicated: Secondary | ICD-10-CM | POA: Insufficient documentation

## 2016-09-12 DIAGNOSIS — Z202 Contact with and (suspected) exposure to infections with a predominantly sexual mode of transmission: Secondary | ICD-10-CM | POA: Insufficient documentation

## 2016-09-12 MED ORDER — METRONIDAZOLE 500 MG PO TABS
2000.0000 mg | ORAL_TABLET | Freq: Once | ORAL | Status: AC
  Administered 2016-09-12: 2000 mg via ORAL
  Filled 2016-09-12: qty 4

## 2016-09-12 NOTE — ED Triage Notes (Signed)
Pt denies symptoms, but reports having encounter with STD. Pt requesting to be checked.

## 2016-09-12 NOTE — Discharge Instructions (Signed)
You have been tested for STDs. Some of these results are still pending. Any abnormalities will be called to you. Be sure to follow safe sex practices, including monogamy and/or condom use. Go to the Health Department for any future STD testing.

## 2016-09-12 NOTE — ED Notes (Signed)
Pt refusing lab work
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
## 2016-09-13 LAB — GC/CHLAMYDIA PROBE AMP (~~LOC~~) NOT AT ARMC
Chlamydia: NEGATIVE
Neisseria Gonorrhea: POSITIVE — AB

## 2016-09-15 NOTE — ED Notes (Signed)
Attempted to call patient at [phone] person that answered stated it was the wrong number. Letter sent.

## 2016-11-25 ENCOUNTER — Encounter (HOSPITAL_COMMUNITY): Payer: Self-pay | Admitting: Emergency Medicine

## 2016-11-25 ENCOUNTER — Emergency Department (HOSPITAL_COMMUNITY)
Admission: EM | Admit: 2016-11-25 | Discharge: 2016-11-25 | Disposition: A | Discharge status: Home / Self Care | Payer: Self-pay | Attending: Emergency Medicine | Admitting: Emergency Medicine

## 2016-11-25 DIAGNOSIS — Z79899 Other long term (current) drug therapy: Secondary | ICD-10-CM | POA: Insufficient documentation

## 2016-11-25 DIAGNOSIS — F17210 Nicotine dependence, cigarettes, uncomplicated: Secondary | ICD-10-CM | POA: Insufficient documentation

## 2016-11-25 DIAGNOSIS — J020 Streptococcal pharyngitis: Secondary | ICD-10-CM | POA: Insufficient documentation

## 2016-11-25 LAB — RAPID STREP SCREEN (MED CTR MEBANE ONLY): Streptococcus, Group A Screen (Direct): POSITIVE — AB

## 2016-11-25 MED ORDER — AMOXICILLIN 500 MG PO CAPS
1000.0000 mg | ORAL_CAPSULE | Freq: Two times a day (BID) | ORAL | 0 refills | Status: DC
Start: 2016-11-25 — End: 2017-06-08

## 2016-11-25 MED ORDER — IBUPROFEN 400 MG PO TABS
400.0000 mg | ORAL_TABLET | Freq: Once | ORAL | Status: AC
  Administered 2016-11-25: 400 mg via ORAL
  Filled 2016-11-25: qty 1

## 2016-11-25 MED ORDER — AMOXICILLIN 500 MG PO CAPS
1000.0000 mg | ORAL_CAPSULE | Freq: Once | ORAL | Status: AC
  Administered 2016-11-25: 1000 mg via ORAL
  Filled 2016-11-25: qty 2

## 2016-11-25 NOTE — ED Notes (Signed)
Pt given something to drink.  °
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
## 2016-11-25 NOTE — Discharge Instructions (Signed)
For pain control please take ibuprofen (also known as Motrin or Advil) 800mg (this is normally 4 over the counter pills) 3 times a day  for 5 days. Take with food to minimize stomach irritation. ° ° °Please follow with your primary care doctor in the next 2 days for a check-up. They must obtain records for further management.  ° °Do not hesitate to return to the Emergency Department for any new, worsening or concerning symptoms.  ° °

## 2016-11-25 NOTE — ED Triage Notes (Signed)
Patient with sore throat for two days and congestion.  Patient states that he has had a cough and vomiting with the cough.

## 2017-06-08 ENCOUNTER — Emergency Department (HOSPITAL_COMMUNITY)
Admission: EM | Admit: 2017-06-08 | Discharge: 2017-06-08 | Disposition: A | Discharge status: Home / Self Care | Payer: Self-pay | Attending: Emergency Medicine | Admitting: Emergency Medicine

## 2017-06-08 ENCOUNTER — Emergency Department (HOSPITAL_COMMUNITY): Payer: Self-pay

## 2017-06-08 ENCOUNTER — Encounter (HOSPITAL_COMMUNITY): Payer: Self-pay

## 2017-06-08 DIAGNOSIS — M25562 Pain in left knee: Secondary | ICD-10-CM | POA: Insufficient documentation

## 2017-06-08 DIAGNOSIS — H9192 Unspecified hearing loss, left ear: Secondary | ICD-10-CM | POA: Insufficient documentation

## 2017-06-08 DIAGNOSIS — F17210 Nicotine dependence, cigarettes, uncomplicated: Secondary | ICD-10-CM | POA: Insufficient documentation
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
## 2017-06-08 NOTE — ED Triage Notes (Signed)
Pt here for left knee pain with walking and left ear pain feels like hearing is different in left ear.

## 2017-06-08 NOTE — Discharge Instructions (Signed)
Take two naproxen (Aleve) tablets twice a day. Apply ice to your knee as needed.  Do not put anything in your ear - you can damage the eardrum.

## 2017-06-08 NOTE — ED Notes (Signed)
Unable to locate

## 2017-09-23 ENCOUNTER — Encounter (HOSPITAL_COMMUNITY): Payer: Self-pay | Admitting: Emergency Medicine

## 2017-09-23 ENCOUNTER — Other Ambulatory Visit: Payer: Self-pay

## 2017-09-23 ENCOUNTER — Ambulatory Visit (HOSPITAL_COMMUNITY)
Admission: EM | Admit: 2017-09-23 | Discharge: 2017-09-23 | Disposition: A | Discharge status: Home / Self Care | Payer: Self-pay | Attending: Student | Admitting: Student

## 2017-09-23 DIAGNOSIS — J069 Acute upper respiratory infection, unspecified: Secondary | ICD-10-CM

## 2017-09-23 DIAGNOSIS — Z720 Tobacco use: Secondary | ICD-10-CM

## 2017-09-23 DIAGNOSIS — R059 Cough, unspecified: Secondary | ICD-10-CM

## 2017-09-23 DIAGNOSIS — R05 Cough: Secondary | ICD-10-CM

## 2017-09-23 MED ORDER — AZITHROMYCIN 250 MG PO TABS: ORAL_TABLET | ORAL | 0 refills | Status: DC

## 2017-09-23 MED ORDER — HYDROCODONE-HOMATROPINE 5-1.5 MG/5ML PO SYRP: 5.0000 mL | ORAL_SOLUTION | Freq: Four times a day (QID) | ORAL | 0 refills | Status: DC | PRN

## 2017-09-23 NOTE — Discharge Instructions (Signed)
-  Take medications as prescribed. -Continue to decrease the amount of smoking that you do. -Follow-up if no improvement of symptoms.
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
## 2017-09-23 NOTE — ED Triage Notes (Signed)
The patient presented to the [facility] with a complaint of a cough x 1 week. 

## 2019-01-12 ENCOUNTER — Ambulatory Visit (HOSPITAL_COMMUNITY)
Admission: EM | Admit: 2019-01-12 | Discharge: 2019-01-12 | Disposition: A | Discharge status: Home / Self Care | Payer: Self-pay | Attending: Family Medicine | Admitting: Family Medicine

## 2019-01-12 ENCOUNTER — Encounter (HOSPITAL_COMMUNITY): Payer: Self-pay

## 2019-01-12 DIAGNOSIS — H66002 Acute suppurative otitis media without spontaneous rupture of ear drum, left ear: Secondary | ICD-10-CM

## 2019-01-12 DIAGNOSIS — Z720 Tobacco use: Secondary | ICD-10-CM

## 2019-01-12 DIAGNOSIS — J069 Acute upper respiratory infection, unspecified: Secondary | ICD-10-CM

## 2019-01-12 DIAGNOSIS — R208 Other disturbances of skin sensation: Secondary | ICD-10-CM

## 2019-01-12 LAB — GLUCOSE, CAPILLARY: Glucose-Capillary: 93 mg/dL (ref 70–99)

## 2019-01-12 MED ORDER — AMOXICILLIN 875 MG PO TABS: 875.0000 mg | ORAL_TABLET | Freq: Two times a day (BID) | ORAL | 0 refills | Status: DC

## 2019-01-12 NOTE — ED Triage Notes (Signed)
Pt present coughing and feet burning.  Pt states the feet severly burning started over a month ago but has became discomfort from standing long periods of time.  Pt states the cough started 3 days ago.

## 2019-01-12 NOTE — Discharge Instructions (Signed)
Drink plenty of water Try to reduce or stop smoking Take a multi vitamin daily  Take the amoxil 2 x a day for the ear infection You need a primary care doctor if your feet continue to hurt
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
## 2019-01-28 ENCOUNTER — Ambulatory Visit (HOSPITAL_COMMUNITY)
Admission: EM | Admit: 2019-01-28 | Discharge: 2019-01-28 | Disposition: A | Discharge status: Home / Self Care | Payer: Self-pay | Attending: Physician Assistant | Admitting: Physician Assistant

## 2019-01-28 ENCOUNTER — Other Ambulatory Visit: Payer: Self-pay

## 2019-01-28 DIAGNOSIS — R1013 Epigastric pain: Secondary | ICD-10-CM

## 2019-01-28 MED ORDER — ONDANSETRON 4 MG PO TBDP
4.0000 mg | ORAL_TABLET | Freq: Once | ORAL | Status: AC
Start: 2019-01-28 — End: 2019-01-28
  Administered 2019-01-28: 4 mg via ORAL

## 2019-01-28 MED ORDER — ONDANSETRON 4 MG PO TBDP
ORAL_TABLET | ORAL | Status: AC
  Filled 2019-01-28: qty 1

## 2019-01-28 NOTE — ED Triage Notes (Signed)
Per pt he woke up today with a twisting feeling in his stomach with diarrhea. Pt says he is not hurting at this time and did have diarrhea while waiting. Some nausea feeling. No fevers.

## 2019-01-28 NOTE — Discharge Instructions (Addendum)
Return for recheck if symptoms worsen or change.
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
## 2019-07-01 ENCOUNTER — Ambulatory Visit (HOSPITAL_COMMUNITY)
Admission: EM | Admit: 2019-07-01 | Discharge: 2019-07-01 | Disposition: A | Discharge status: Home / Self Care | Payer: Self-pay | Attending: Family Medicine | Admitting: Family Medicine

## 2019-07-01 ENCOUNTER — Telehealth (HOSPITAL_COMMUNITY): Payer: Self-pay

## 2019-07-01 ENCOUNTER — Encounter (HOSPITAL_COMMUNITY): Payer: Self-pay

## 2019-07-01 ENCOUNTER — Other Ambulatory Visit: Payer: Self-pay

## 2019-07-01 DIAGNOSIS — J069 Acute upper respiratory infection, unspecified: Secondary | ICD-10-CM

## 2019-07-01 DIAGNOSIS — Z20828 Contact with and (suspected) exposure to other viral communicable diseases: Secondary | ICD-10-CM | POA: Insufficient documentation

## 2019-07-01 DIAGNOSIS — H66002 Acute suppurative otitis media without spontaneous rupture of ear drum, left ear: Secondary | ICD-10-CM

## 2019-07-01 DIAGNOSIS — F17210 Nicotine dependence, cigarettes, uncomplicated: Secondary | ICD-10-CM | POA: Insufficient documentation

## 2019-07-01 MED ORDER — AMOXICILLIN 875 MG PO TABS: 875.0000 mg | ORAL_TABLET | Freq: Two times a day (BID) | ORAL | 0 refills | Status: DC

## 2019-07-01 NOTE — ED Provider Notes (Signed)
[facility]   [id number] 07/01/19 Arrival Time: 1220  ASSESSMENT & PLAN:  1. Viral upper respiratory tract infection   2. Non-recurrent acute suppurative otitis media of left ear without spontaneous rupture of tympanic membrane     COVID-19 testing sent. Will self-quarantine until test results available.  For ear infection, will begin: Meds ordered this encounter  Medications  . amoxicillin (AMOXIL) 875 MG tablet    Sig: Take 1 tablet (875 mg total) by mouth 2 (two) times daily.    Dispense:  20 tablet    Refill:  0   Discussed typical duration of symptoms for viral illness. OTC symptom care as needed. Ensure adequate fluid intake and rest. May f/u with PCP or here as needed.  Reviewed expectations re: course of current medical issues. Questions answered. Outlined signs and symptoms indicating need for more acute intervention. Patient verbalized understanding. After Visit Summary given.   SUBJECTIVE: History from: patient.  Cameron Cruz is a 28 y.o. male who presents with complaint of nasal congestion, post-nasal drainage without respiratory symptoms and without sore throat. Onset abrupt, 2-3 d ago; without fatigue and without body aches. SOB: none. Wheezing: none. Fever: none suspected. Overall normal PO intake without n/v. Known sick contacts or COVID-19 contacts: no. No specific or significant aggravating or alleviating factors reported. Reports left otalgia first noticed early this morning. No bleeding or drainage from left ear. Normal hearing. OTC treatment: none reported.   Social History   Tobacco Use  Smoking Status Current Every Day Smoker  . Packs/day: 1.00  . Years: 5.00  . Pack years: 5.00  . Types: Cigarettes  Smokeless Tobacco Never Used    ROS: As per HPI.   OBJECTIVE:  Vitals:   07/01/19 1310  BP: 132/79  Pulse: (!) 106  Resp: 18  Temp: 98.6 F (37 C)  SpO2: 98%     General appearance: alert; NAD HEENT: nasal  congestion; clear runny nose; throat irritation secondary to post-nasal drainage; L TM erythematous and bulging Neck: supple without LAD CV: RRR Lungs: unlabored respirations, symmetrical air entry without wheezing; cough: absent Abd: soft Ext: no LE edema Skin: warm and dry Psychological: alert and cooperative; normal mood and affect  No Known Allergies  Past Medical History:  Diagnosis Date  . Attention deficit disorder as a child   Family History  Family history unknown: Yes   Social History   Socioeconomic History  . Marital status: Single    Spouse name: Not on file  . Number of children: Not on file  . Years of education: Not on file  . Highest education level: Not on file  Occupational History  . Not on file  Social Needs  . Financial resource strain: Not on file  . Food insecurity    Worry: Not on file    Inability: Not on file  . Transportation needs    Medical: Not on file    Non-medical: Not on file  Tobacco Use  . Smoking status: Current Every Day Smoker    Packs/day: 1.00    Years: 5.00    Pack years: 5.00    Types: Cigarettes  . Smokeless tobacco: Never Used  Substance and Sexual Activity  . Alcohol use: No    Alcohol/week: 3.0 standard drinks    Types: 3 Cans of beer per week  . Drug use: No  . Sexual activity: Yes  Lifestyle  . Physical activity    Days per week: Not on file  Minutes per session: Not on file  . Stress: Not on file  Relationships  . Social Herbalist on phone: Not on file    Gets together: Not on file    Attends religious service: Not on file    Active member of club or organization: Not on file    Attends meetings of clubs or organizations: Not on file    Relationship status: Not on file  . Intimate partner violence    Fear of current or ex partner: Not on file    Emotionally abused: Not on file    Physically abused: Not on file    Forced sexual activity: Not on file  Other Topics Concern  . Not on file   Social History Narrative  . Not on file           Vanessa Kick, MD 07/01/19 1347

## 2019-07-01 NOTE — ED Triage Notes (Signed)
Pt presents with complaints of runny nose, nasal congestion and sore throat x 1 day.

## 2019-07-03 LAB — NOVEL CORONAVIRUS, NAA (HOSP ORDER, SEND-OUT TO REF LAB; TAT 18-24 HRS): SARS-CoV-2, NAA: NOT DETECTED

## 2019-07-04 ENCOUNTER — Telehealth: Payer: Self-pay | Admitting: General Practice

## 2019-07-04 NOTE — Telephone Encounter (Signed)
Pt called in for covid results.   Advised of NOT DETECTED result.

## 2020-09-10 ENCOUNTER — Ambulatory Visit (HOSPITAL_COMMUNITY)
Admission: EM | Admit: 2020-09-10 | Discharge: 2020-09-10 | Disposition: A | Discharge status: Home / Self Care | Payer: Self-pay | Attending: Family Medicine | Admitting: Family Medicine

## 2020-09-10 ENCOUNTER — Other Ambulatory Visit: Payer: Self-pay

## 2020-09-10 ENCOUNTER — Encounter (HOSPITAL_COMMUNITY): Payer: Self-pay | Admitting: Emergency Medicine

## 2020-09-10 DIAGNOSIS — R519 Headache, unspecified: Secondary | ICD-10-CM

## 2020-09-10 DIAGNOSIS — K529 Noninfective gastroenteritis and colitis, unspecified: Secondary | ICD-10-CM

## 2020-09-10 DIAGNOSIS — R531 Weakness: Secondary | ICD-10-CM

## 2020-09-10 DIAGNOSIS — R61 Generalized hyperhidrosis: Secondary | ICD-10-CM

## 2020-09-10 DIAGNOSIS — R109 Unspecified abdominal pain: Secondary | ICD-10-CM

## 2020-09-10 LAB — CBG MONITORING, ED: Glucose-Capillary: 123 mg/dL — ABNORMAL HIGH (ref 70–99)

## 2020-09-10 MED ORDER — ONDANSETRON 4 MG PO TBDP
4.0000 mg | ORAL_TABLET | Freq: Once | ORAL | Status: AC
  Administered 2020-09-10: 4 mg via ORAL

## 2020-09-10 MED ORDER — ONDANSETRON 4 MG PO TBDP: 4.0000 mg | ORAL_TABLET | Freq: Three times a day (TID) | ORAL | 0 refills | Status: DC | PRN

## 2020-09-10 MED ORDER — ONDANSETRON 4 MG PO TBDP
ORAL_TABLET | ORAL | Status: AC
  Filled 2020-09-10: qty 1

## 2020-09-10 NOTE — Discharge Instructions (Addendum)
This is most likely related to something you  ate or some sort of virus. Zofran as needed.  Make sure you are hydrating with Gatorade and water to replace electrolytes. Bland diet for now and advance diet as tolerated Follow up as needed for continued or worsening symptoms

## 2020-09-10 NOTE — ED Triage Notes (Signed)
Pt states this morning around 10 am he started having abd pain, weakness, and excessive sweating. Pt states that around 4 am he ate a pork chop that had been sitting out since yesterday. Pt states he needs a work note for today.
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
## 2021-02-14 ENCOUNTER — Other Ambulatory Visit: Payer: Self-pay

## 2021-02-14 ENCOUNTER — Encounter (HOSPITAL_COMMUNITY): Payer: Self-pay | Admitting: *Deleted

## 2021-02-14 ENCOUNTER — Ambulatory Visit (HOSPITAL_COMMUNITY)
Admission: EM | Admit: 2021-02-14 | Discharge: 2021-02-14 | Disposition: A | Discharge status: Home / Self Care | Payer: Self-pay | Attending: Emergency Medicine | Admitting: Emergency Medicine

## 2021-02-14 DIAGNOSIS — Z113 Encounter for screening for infections with a predominantly sexual mode of transmission: Secondary | ICD-10-CM | POA: Insufficient documentation

## 2021-02-14 LAB — HIV ANTIBODY (ROUTINE TESTING W REFLEX): HIV Screen 4th Generation wRfx: NONREACTIVE

## 2021-02-14 NOTE — ED Provider Notes (Signed)
[facility]   [id number] 02/14/21 Arrival Time: 1327   CC: CONCERN FOR STD  SUBJECTIVE:  Cameron Cruz is a 30 y.o. male who presents requesting STI screening.  Currently asymptomatic.  No known contact with STI positive partners. Denies similar symptoms in the past.    Denies fever, chills, nausea, vomiting, abdominal or pelvic pain, penile rashes or lesions, testicular swelling or pain.      No LMP for male patient.  ROS: As per HPI.  All other pertinent ROS negative.     Past Medical History:  Diagnosis Date  . 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 No pertinent surgical history. No Known Allergies No current facility-administered medications on file prior to encounter.   Current Outpatient Medications on File Prior to Encounter  Medication Sig Dispense Refill  . ondansetron (ZOFRAN ODT) 4 MG disintegrating tablet Take 1 tablet (4 mg total) by mouth every 8 (eight) hours as needed for nausea or vomiting. 20 tablet 0   Social History   Socioeconomic History  . Marital status: Single    Spouse name: Not on file  . Number of children: Not on file  . Years of education: Not on file  . Highest education level: Not on file  Occupational History  . Not on file  Tobacco Use  . Smoking status: Current Every Day Smoker    Packs/day: 1.00    Years: 5.00    Pack years: 5.00    Types: Cigarettes  . Smokeless tobacco: Never Used  Vaping Use  . Vaping Use: Never used  Substance and Sexual Activity  . Alcohol use: Yes    Comment: occasionally  . Drug use: Never  . Sexual activity: Yes  Other Topics Concern  . Not on file  Social History Narrative  . Not on file   Social Determinants of Health   Financial Resource Strain: Not on file  Food Insecurity: Not on file  Transportation Needs: Not on file  Physical Activity: Not on file  Stress: Not on file  Social Connections: Not on file  Intimate Partner Violence: Not on file    Family History  Family history unknown: Yes    OBJECTIVE:  Vitals:   02/14/21 1420  BP: 137/90  Pulse: 91  Resp: 16  Temp: 99 F (37.2 C)  TempSrc: Oral  SpO2: 96%     General appearance: alert, NAD, appears stated age Head: NCAT Throat: lips, mucosa, and tongue normal; teeth and gums normal Lungs: CTA bilaterally without adventitious breath sounds Heart: regular rate and rhythm.  Radial pulses 2+ symmetrical bilaterally Back: no CVA tenderness Abdomen: soft, non-tender; bowel sounds normal; no masses or organomegaly; no guarding or rebound tenderness GU: deferred Skin: warm and dry Psychological:  Alert and cooperative. Normal mood and affect.  LABS:  Results for orders placed or performed during the hospital encounter of 09/10/20  POC CBG monitoring  Result Value Ref Range   Glucose-Capillary 123 (H) 70 - 99 mg/dL    Labs Reviewed  RPR  HIV ANTIBODY (ROUTINE TESTING W REFLEX)  CYTOLOGY, (ORAL, ANAL, URETHRAL) ANCILLARY ONLY    ASSESSMENT & PLAN:  1. Screen for STD (sexually transmitted disease)     No orders of the defined types were placed in this encounter.   Pending: Labs Reviewed  RPR  HIV ANTIBODY (ROUTINE TESTING W REFLEX)  CYTOLOGY, (ORAL, ANAL, URETHRAL) ANCILLARY ONLY    Self swab and HIV, RPR labs obtained.  Will treat based  on results. If tests are positive, please abstain from sexual activity until you and your partner(s) are treated  Follow up with PCP or [facility] if symptoms persists Return here or go to ER if you have any new or worsening symptoms    Reviewed expectations re: course of current medical issues. Questions answered. Outlined signs and symptoms indicating need for more acute intervention. Patient verbalized understanding. After Visit Summary given.       Ivette Loyal, NP 02/14/21 1452

## 2021-02-14 NOTE — Discharge Instructions (Addendum)
We will contact you with the results from your lab work and any additional treatment.    Do not have sex while taking undergoing treatment for STI.  Make sure that all of your partners get tested and treated.   Use a condom or other barrier method for all sexual encounters.    Return or go to the Emergency Department if symptoms worsen or do not improve in the next few days.   

## 2021-02-14 NOTE — ED Triage Notes (Signed)
Denies any c/o's or known exposures; states just wishes to be checked for STDs.

## 2021-02-15 ENCOUNTER — Telehealth (HOSPITAL_COMMUNITY): Payer: Self-pay | Admitting: Emergency Medicine

## 2021-02-15 LAB — RPR: RPR Ser Ql: NONREACTIVE

## 2021-02-15 LAB — CYTOLOGY, (ORAL, ANAL, URETHRAL) ANCILLARY ONLY
Chlamydia: NEGATIVE
Comment: NEGATIVE
Comment: NEGATIVE
Comment: NORMAL
Neisseria Gonorrhea: NEGATIVE
Trichomonas: POSITIVE — AB

## 2021-02-15 MED ORDER — METRONIDAZOLE 500 MG PO TABS
500.0000 mg | ORAL_TABLET | Freq: Two times a day (BID) | ORAL | 0 refills | Status: DC
Start: 2021-02-15 — End: 2021-11-27

## 2021-04-01 ENCOUNTER — Ambulatory Visit (HOSPITAL_COMMUNITY)
Admission: EM | Admit: 2021-04-01 | Discharge: 2021-04-01 | Disposition: A | Discharge status: Home / Self Care | Payer: Self-pay | Attending: Emergency Medicine | Admitting: Emergency Medicine

## 2021-04-01 ENCOUNTER — Other Ambulatory Visit: Payer: Self-pay

## 2021-04-01 ENCOUNTER — Encounter (HOSPITAL_COMMUNITY): Payer: Self-pay

## 2021-04-01 DIAGNOSIS — M546 Pain in thoracic spine: Secondary | ICD-10-CM

## 2021-04-01 DIAGNOSIS — L02219 Cutaneous abscess of trunk, unspecified: Secondary | ICD-10-CM

## 2021-04-01 DIAGNOSIS — L02412 Cutaneous abscess of left axilla: Secondary | ICD-10-CM

## 2021-04-01 DIAGNOSIS — L03112 Cellulitis of left axilla: Secondary | ICD-10-CM

## 2021-04-01 MED ORDER — DOXYCYCLINE HYCLATE 100 MG PO CAPS: 100.0000 mg | ORAL_CAPSULE | Freq: Two times a day (BID) | ORAL | 0 refills | Status: AC

## 2021-04-01 MED ORDER — LIDOCAINE HCL (PF) 2 % IJ SOLN
INTRAMUSCULAR | Status: AC
  Filled 2021-04-01: qty 5

## 2021-04-01 NOTE — Discharge Instructions (Addendum)
Take the doxycycline twice a day for the next 10 days.   Wash your armpit at least twice a day with warm soapy water and apply a clean bandage. Put a warm compress (wet a washcloth with warm to hot water) on your armpit 3-4 times a day.    If you notice any worsening redness, swelling, pain, red streaks, or get a fever, come back here or go to the emergency department for re-evaluation.    You can take Ibuprofen and/or Tylenol as needed for your back pain.  Also ensure that you are performing good body mechanics (proper lifting techniques). You can use heat, ice, or alternate heat and ice as needed for comfort.
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
## 2021-04-01 NOTE — ED Triage Notes (Signed)
Pt c/o back pain and left under arm swelling X 1 week.

## 2021-09-07 ENCOUNTER — Emergency Department (HOSPITAL_COMMUNITY)
Admission: EM | Admit: 2021-09-07 | Discharge: 2021-09-07 | Disposition: A | Discharge status: Home / Self Care | Payer: Self-pay | Attending: Emergency Medicine | Admitting: Emergency Medicine

## 2021-09-07 ENCOUNTER — Ambulatory Visit (HOSPITAL_COMMUNITY)
Admission: RE | Admit: 2021-09-07 | Discharge: 2021-09-07 | Disposition: A | Discharge status: Home / Self Care | Payer: Self-pay | Attending: Psychiatry | Admitting: Psychiatry

## 2021-09-07 ENCOUNTER — Emergency Department (HOSPITAL_COMMUNITY): Payer: Self-pay

## 2021-09-07 DIAGNOSIS — F17210 Nicotine dependence, cigarettes, uncomplicated: Secondary | ICD-10-CM | POA: Insufficient documentation

## 2021-09-07 DIAGNOSIS — G8929 Other chronic pain: Secondary | ICD-10-CM | POA: Insufficient documentation

## 2021-09-07 DIAGNOSIS — M545 Low back pain, unspecified: Secondary | ICD-10-CM | POA: Insufficient documentation

## 2021-09-07 DIAGNOSIS — M546 Pain in thoracic spine: Secondary | ICD-10-CM | POA: Insufficient documentation

## 2021-09-07 DIAGNOSIS — R911 Solitary pulmonary nodule: Secondary | ICD-10-CM | POA: Insufficient documentation

## 2021-09-07 LAB — URINALYSIS, ROUTINE W REFLEX MICROSCOPIC
Bilirubin Urine: NEGATIVE
Glucose, UA: NEGATIVE mg/dL
Hgb urine dipstick: NEGATIVE
Ketones, ur: 80 mg/dL — AB
Leukocytes,Ua: NEGATIVE
Nitrite: NEGATIVE
Protein, ur: 30 mg/dL — AB
Specific Gravity, Urine: 1.033 — ABNORMAL HIGH (ref 1.005–1.030)
pH: 5 (ref 5.0–8.0)

## 2021-09-07 LAB — RESP PANEL BY RT-PCR (FLU A&B, COVID) ARPGX2
Influenza A by PCR: NEGATIVE
Influenza B by PCR: NEGATIVE
SARS Coronavirus 2 by RT PCR: NEGATIVE

## 2021-09-07 MED ORDER — CYCLOBENZAPRINE HCL 10 MG PO TABS: 10.0000 mg | ORAL_TABLET | Freq: Two times a day (BID) | ORAL | 0 refills | Status: DC | PRN

## 2021-09-07 MED ORDER — NAPROXEN 375 MG PO TABS: 375.0000 mg | ORAL_TABLET | Freq: Two times a day (BID) | ORAL | 0 refills | Status: DC

## 2021-09-07 MED ORDER — NAPROXEN 375 MG PO TABS
375.0000 mg | ORAL_TABLET | Freq: Two times a day (BID) | ORAL | 0 refills | Status: DC
Start: 2021-09-07 — End: 2021-11-27
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
## 2021-09-07 NOTE — ED Triage Notes (Signed)
Pt c/o back pain when coughing x "awhile," associated lower abd pain. No urinary symptoms, no NVD, no other complaints.
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
## 2021-09-07 NOTE — Discharge Instructions (Signed)
The radiologist recommended doing a CT scan of your chest to evaluate the lung nodule.  Follow-up with a primary care doctor or you could try seeing an urgent care to have that arranged

## 2021-09-07 NOTE — ED Notes (Signed)
Pt called for room, no answer.

## 2021-09-07 NOTE — H&P (Signed)
Behavioral Health Medical Screening Exam  Cameron Cruz is a 30 y.o. male with a past psychiatric history significant for attention deficit hyperactivity disorder who presents to [facility] for evaluation.  Patient reports that he was referred to [facility] by his adoptive mother Cameron Cruz).  Patient states that the reason for the visit today is for the management of his worsening mental health.  Patient states that he has been exhibiting symptoms that seem to be progressively getting worse.  Patient denies experiencing depression but states that he does experience worthlessness, hopelessness, feelings of guilt, and irritability.  Patient denies anxiety.  Patient states that he is triggered whenever people repeat themselves  He reports that his adoptive mother is the one that informed him about his worsening mental health.  Patient states that his mother believes that he is bipolar.  Patient is not currently taking any medications.  Patient has 2 kids (twins) that are currently with his grandparents.  Patient does not have a psychiatrist or therapist.  Patient is currently working at [employer] but states that he has not been there for quite some time due to wanting to manage his mental health.  Patient is alert and oriented x4, calm, cooperative, and fully engaged in conversation during the encounter.  Patient denies suicidal or homicidal ideations.  He further denies auditory or visual hallucinations and does not appear to be responding to internal/external stimuli.  Patient endorses fluctuating sleep stating that some nights he may receive up to 8 to 9 hours of sleep each night while on others he may experience staying up until 4:00 in the morning.  Patient endorses normal appetite.  Patient denies alcohol consumption and illicit drug use.  Patient endorses tobacco use and states that he smokes on average a pack per day.  Patient has no past history of hospitalization due to mental  health.  Patient also denies a history of self-harm.  Patient has no past history of suicide attempt.  Total Time spent with patient: 20 minutes  Psychiatric Specialty Exam: Physical Exam Psychiatric:        Attention and Perception: Attention and perception normal. He does not perceive auditory or visual hallucinations.        Mood and Affect: Affect normal. Mood is depressed.        Speech: Speech normal.        Behavior: Behavior normal. Behavior is cooperative.        Thought Content: Thought content is not paranoid or delusional. Thought content does not include homicidal or suicidal ideation.        Cognition and Memory: Cognition and memory normal.        Judgment: Judgment normal.   Review of Systems  Psychiatric/Behavioral:  Positive for sleep disturbance. Negative for agitation, decreased concentration, dysphoric mood, hallucinations, self-injury and suicidal ideas. The patient is not nervous/anxious and is not hyperactive.   There were no vitals taken for this visit.There is no height or weight on file to calculate BMI. General Appearance: Fairly Groomed Eye Contact:  Good Speech:  Clear and Coherent and Normal Rate Volume:  Normal Mood:  Euthymic Affect:  Appropriate Thought Process:  Coherent, Goal Directed, and Descriptions of Associations: Intact Orientation:  Full (Time, Place, and Person) Thought Content:  WDL Suicidal Thoughts:  No Homicidal Thoughts:  No Memory:  Immediate;   Good Recent;   Good Remote;   Good Judgement:  Good Insight:  Fair Psychomotor Activity:  Normal Concentration: Concentration: Good and Attention Span:  Good Recall:  [name] of Knowledge:Fair Language: Good Akathisia:  NA Handed:  Right AIMS (if indicated):    Assets:  Communication Skills Desire for Improvement Social Support Vocational/Educational Sleep:     Musculoskeletal: Strength & Muscle Tone: within normal limits Gait & Station: normal Patient leans: N/A  There  were no vitals taken for this visit.  Recommendations: Based on my evaluation the patient does not appear to have an emergency medical condition.  Patient was asked if he was able to contract for safety following the conclusion of the encounter.  Patient reports that if he were unable to receive psychiatric help, then he would be very upset with himself.  Patient states that he is unsure if he would be able to keep himself safe following discharge.  Patient reports that he has a history of saying statements that he would be better off not living anymore when in heated discussion.  Based on my evaluation, patient should be admitted to [facility] for continuous observation. Covid tests have been ordered and placed. Patient transport to be notified to send patient over to behavioral health hospital.  Meta Hatchet, PA 09/07/2021, 9:03 PM

## 2021-09-07 NOTE — ED Notes (Addendum)
Called pt on phone pt stated he walked to the store and that he will be back

## 2021-09-07 NOTE — ED Notes (Signed)
This RN attempted to get blood, pt refused. MD notified

## 2021-09-07 NOTE — ED Notes (Signed)
Pt verbalizes understanding of discharge instructions. Opportunity for questions and answers were provided. Pt discharged from the ED.   ?

## 2021-09-08 ENCOUNTER — Other Ambulatory Visit (HOSPITAL_COMMUNITY)
Admission: EM | Admit: 2021-09-08 | Discharge: 2021-09-08 | Disposition: A | Discharge status: Home / Self Care | Payer: No Payment, Other | Attending: Physician Assistant | Admitting: Physician Assistant

## 2021-09-08 ENCOUNTER — Encounter (HOSPITAL_COMMUNITY): Payer: Self-pay

## 2021-09-08 DIAGNOSIS — F17210 Nicotine dependence, cigarettes, uncomplicated: Secondary | ICD-10-CM | POA: Diagnosis not present

## 2021-09-08 DIAGNOSIS — F332 Major depressive disorder, recurrent severe without psychotic features: Secondary | ICD-10-CM | POA: Insufficient documentation

## 2021-09-08 DIAGNOSIS — F909 Attention-deficit hyperactivity disorder, unspecified type: Secondary | ICD-10-CM | POA: Diagnosis not present

## 2021-09-08 DIAGNOSIS — F339 Major depressive disorder, recurrent, unspecified: Secondary | ICD-10-CM

## 2021-09-08 DIAGNOSIS — Z5902 Unsheltered homelessness: Secondary | ICD-10-CM | POA: Diagnosis present

## 2021-09-08 DIAGNOSIS — Z5901 Sheltered homelessness: Secondary | ICD-10-CM | POA: Diagnosis present

## 2021-09-08 LAB — HEMOGLOBIN A1C
Hgb A1c MFr Bld: 5.4 % (ref 4.8–5.6)
Mean Plasma Glucose: 108.28 mg/dL

## 2021-09-08 LAB — CBC WITH DIFFERENTIAL/PLATELET
Abs Immature Granulocytes: 0.03 10*3/uL (ref 0.00–0.07)
Basophils Absolute: 0 10*3/uL (ref 0.0–0.1)
Basophils Relative: 1 %
Eosinophils Absolute: 0.1 10*3/uL (ref 0.0–0.5)
Eosinophils Relative: 2 %
HCT: 45 % (ref 39.0–52.0)
Hemoglobin: 15.9 g/dL (ref 13.0–17.0)
Immature Granulocytes: 0 %
Lymphocytes Relative: 36 %
Lymphs Abs: 2.8 10*3/uL (ref 0.7–4.0)
MCH: 31.3 pg (ref 26.0–34.0)
MCHC: 35.3 g/dL (ref 30.0–36.0)
MCV: 88.6 fL (ref 80.0–100.0)
Monocytes Absolute: 0.6 10*3/uL (ref 0.1–1.0)
Monocytes Relative: 7 %
Neutro Abs: 4.3 10*3/uL (ref 1.7–7.7)
Neutrophils Relative %: 54 %
Platelets: 233 10*3/uL (ref 150–400)
RBC: 5.08 MIL/uL (ref 4.22–5.81)
RDW: 12.3 % (ref 11.5–15.5)
WBC: 7.9 10*3/uL (ref 4.0–10.5)
nRBC: 0 % (ref 0.0–0.2)

## 2021-09-08 LAB — COMPREHENSIVE METABOLIC PANEL
ALT: 39 U/L (ref 0–44)
AST: 26 U/L (ref 15–41)
Albumin: 4.1 g/dL (ref 3.5–5.0)
Alkaline Phosphatase: 89 U/L (ref 38–126)
Anion gap: 8 (ref 5–15)
BUN: 11 mg/dL (ref 6–20)
CO2: 26 mmol/L (ref 22–32)
Calcium: 9.4 mg/dL (ref 8.9–10.3)
Chloride: 104 mmol/L (ref 98–111)
Creatinine, Ser: 0.78 mg/dL (ref 0.61–1.24)
GFR, Estimated: >60 mL/min (ref >60)
Glucose, Bld: 101 mg/dL — ABNORMAL HIGH (ref 70–99)
Potassium: 3.6 mmol/L (ref 3.5–5.1)
Sodium: 138 mmol/L (ref 135–145)
Total Bilirubin: 1.7 mg/dL — ABNORMAL HIGH (ref 0.3–1.2)
Total Protein: 6.4 g/dL — ABNORMAL LOW (ref 6.5–8.1)

## 2021-09-08 LAB — POCT URINE DRUG SCREEN - MANUAL ENTRY (I-SCREEN)
POC Amphetamine UR: NOT DETECTED
POC Buprenorphine (BUP): NOT DETECTED
POC Cocaine UR: NOT DETECTED
POC Marijuana UR: NOT DETECTED
POC Methadone UR: NOT DETECTED
POC Methamphetamine UR: NOT DETECTED
POC Morphine: NOT DETECTED
POC Oxazepam (BZO): NOT DETECTED
POC Oxycodone UR: NOT DETECTED
POC Secobarbital (BAR): NOT DETECTED

## 2021-09-08 LAB — LIPID PANEL
Cholesterol: 151 mg/dL (ref 0–200)
HDL: 36 mg/dL — ABNORMAL LOW (ref >40)
LDL Cholesterol: 104 mg/dL — ABNORMAL HIGH (ref 0–99)
Total CHOL/HDL Ratio: 4.2 RATIO
Triglycerides: 57 mg/dL (ref <150)
VLDL: 11 mg/dL (ref 0–40)

## 2021-09-08 LAB — TSH: TSH: 1.198 u[IU]/mL (ref 0.350–4.500)

## 2021-09-08 MED ORDER — ALUM & MAG HYDROXIDE-SIMETH 200-200-20 MG/5ML PO SUSP: 30.0000 mL | ORAL | Status: DC | PRN

## 2021-09-08 MED ORDER — MAGNESIUM HYDROXIDE 400 MG/5ML PO SUSP: 30.0000 mL | Freq: Every day | ORAL | Status: DC | PRN

## 2021-09-08 MED ORDER — HYDROXYZINE HCL 25 MG PO TABS: 25.0000 mg | ORAL_TABLET | Freq: Three times a day (TID) | ORAL | Status: DC | PRN

## 2021-09-08 MED ORDER — TRAZODONE HCL 50 MG PO TABS: 50.0000 mg | ORAL_TABLET | Freq: Every evening | ORAL | Status: DC | PRN

## 2021-09-08 MED ORDER — ACETAMINOPHEN 325 MG PO TABS: 650.0000 mg | ORAL_TABLET | Freq: Four times a day (QID) | ORAL | Status: DC | PRN

## 2021-09-08 NOTE — ED Notes (Signed)
Pt sleeping in no acute distress. RR even and unlabored. Safety maintained. 

## 2021-09-08 NOTE — ED Notes (Signed)
Patient A&O x 4, ambulatory. Patient discharged in no acute distress. Patient denied SI/HI, A/VH upon discharge. Patient verbalized understanding of all discharge instructions explained by staff, to include follow up appointments and safety plan. Patient reported mood 10/10.  Pt belongings returned to patient from locker # 29 intact. Patient escorted to lobby via staff with bus pass in hand. Safety maintained.

## 2021-09-08 NOTE — ED Notes (Signed)
Pt is currently sleeping, no distress noted, will continue to monitor for safety

## 2021-09-08 NOTE — ED Provider Notes (Signed)
Behavioral Health Admission H&P [facility] & OBS)  Date: 09/08/21 Patient Name: Cameron Cruz MRN: [id number] Chief Complaint: No chief complaint on file.     Diagnoses:  Final diagnoses:  Episode of recurrent major depressive disorder, unspecified depression episode severity (HCC)    HPI:   Cameron Cruz "Cameron Cruz" is a 30 year old male with a past psychiatric history significant for attention deficit hyperactivity disorder who presents to [facility] from [facility] via Patient Transport for evaluation.  Patient reports that he feels that he does not need management for mental health.  He reports that he does his best to remain cool, calm, and collected and rarely has any issues.  Patient reports that his main source of stress is his adoptive mother.  He states that it was mainly her idea for the patient to be seen at [facility] but he feels that he does not need to be here.  Patient goes on to say that he knows he is not crazy.  Patient is currently homeless and has been for the past 2 days.  Prior to being homeless, patient was living with his adoptive mother.  On conclusion of the encounter, patient requested information and resources for housing.  Patient denies depression but states that he does experience the following symptoms: feelings of sadness and lack of motivation.  Patient denies difficulty getting out of bed, feelings of guilt/worthlessness, or intractable crying spells.  Patient further denies anxiety.  The only source of stress that the patient has is his adoptive mother.  Patient is alert and oriented x4, calm, cooperative, and fully engaged in conversation during the encounter.  Patient denies suicidal or homicidal ideations.  He further denies auditory or visual hallucinations and does not appear to be responding to internal/external stimuli.  Patient endorses good sleep when living at his adoptive mother's house.  Patient receives  up to 8 to 9 hours of sleep each night when living with his adoptive mother.  Since being homeless, patient reports that he has had barely any sleep.  Patient endorses good appetite but states that he has not eaten anything in roughly 2 days.  Patient endorses tobacco use and smokes on average a pack per day.  Patient denies alcohol consumption or illicit drug use.  PHQ 2-9:   Flowsheet Row ED from 09/08/2021 in [facility] ED from 09/07/2021 in [facility] EMERGENCY DEPARTMENT ED from 04/01/2021 in [facility] at [facility] RISK CATEGORY No Risk No Risk No Risk        Total Time spent with patient: 20 minutes  Musculoskeletal  Strength & Muscle Tone: within normal limits Gait & Station: normal Patient leans: N/A  Psychiatric Specialty Exam  Presentation General Appearance: Appropriate for Environment; Fairly Groomed  Eye Contact:Good  Speech:Clear and Coherent; Normal Rate  Speech Volume:Normal  Handedness:Right   Mood and Affect  Mood:Depressed  Affect:Congruent   Thought Process  Thought Processes:Coherent; Goal Directed; Other (comment)  Descriptions of Associations:Intact  Orientation:Full (Time, Place and Person)  Thought Content:WDL    Hallucinations:Hallucinations: None  Ideas of Reference:None  Suicidal Thoughts:Suicidal Thoughts: No  Homicidal Thoughts:Homicidal Thoughts: No   Sensorium  Memory:Immediate Good; Recent Good; Remote Good  Judgment:Good  Insight:Good   Executive Functions  Concentration:Good  Attention Span:Good  Recall:Good  Fund of Knowledge:Good  Language:Good   Psychomotor Activity  Psychomotor Activity:Psychomotor Activity: Normal   Assets  Assets:Communication Skills; Desire for Improvement; Social Support;  Vocational/Educational   Sleep  Sleep:Sleep: Fair   No data recorded  Physical Exam Psychiatric:        Attention and Perception:  Attention and perception normal. He does not perceive auditory or visual hallucinations.        Mood and Affect: Mood and affect normal.        Speech: Speech normal.        Behavior: Behavior normal. Behavior is cooperative.        Thought Content: Thought content normal. Thought content does not include homicidal or suicidal ideation.        Cognition and Memory: Cognition and memory normal.        Judgment: Judgment normal.   Review of Systems  Psychiatric/Behavioral:  Negative for depression, hallucinations, memory loss, substance abuse and suicidal ideas. The patient has insomnia. The patient is not nervous/anxious.    Blood pressure (!) 121/94, pulse (!) 102, temperature 98.1 F (36.7 C), temperature source Oral, resp. rate 18, SpO2 98 %. There is no height or weight on file to calculate BMI.  Past Psychiatric History:  ADHD   Is the patient at risk to self? No  Has the patient been a risk to self in the past 6 months? No .    Has the patient been a risk to self within the distant past? No   Is the patient a risk to others? No   Has the patient been a risk to others in the past 6 months? No   Has the patient been a risk to others within the distant past? No   Past Medical History:  Past Medical History:  Diagnosis Date   Attention deficit disorder as a child   No past surgical history on file.  Family History:  Family History  Family history unknown: Yes    Social History:  Social History   Socioeconomic History   Marital status: Single    Spouse name: Not on file   Number of children: Not on file   Years of education: Not on file   Highest education level: Not on file  Occupational History   Not on file  Tobacco Use   Smoking status: Every Day    Packs/day: 1.00    Years: 5.00    Pack years: 5.00    Types: Cigarettes   Smokeless tobacco: Never  Vaping Use   Vaping Use: Never used  Substance and Sexual Activity   Alcohol use: Yes    Comment: occasionally    Drug use: Never   Sexual activity: Yes  Other Topics Concern   Not on file  Social History Narrative   Not on file   Social Determinants of Health   Financial Resource Strain: Not on file  Food Insecurity: Not on file  Transportation Needs: Not on file  Physical Activity: Not on file  Stress: Not on file  Social Connections: Not on file  Intimate Partner Violence: Not on file    SDOH:  SDOH Screenings   Alcohol Screen: Not on file  Depression (PHQ2-9): Not on file  Financial Resource Strain: Not on file  Food Insecurity: Not on file  Housing: Not on file  Physical Activity: Not on file  Social Connections: Not on file  Stress: Not on file  Tobacco Use: High Risk   Smoking Tobacco Use: Every Day   Smokeless Tobacco Use: Never   Passive Exposure: Not on file  Transportation Needs: Not on file    Last Labs:  Admission  on 09/08/2021  Component Date Value Ref Range Status   WBC 09/08/2021 7.9  4.0 - 10.5 K/uL Final   RBC 09/08/2021 5.08  4.22 - 5.81 MIL/uL Final   Hemoglobin 09/08/2021 15.9  13.0 - 17.0 g/dL Final   HCT 02/72/5366 45.0  39.0 - 52.0 % Final   MCV 09/08/2021 88.6  80.0 - 100.0 fL Final   MCH 09/08/2021 31.3  26.0 - 34.0 pg Final   MCHC 09/08/2021 35.3  30.0 - 36.0 g/dL Final   RDW 44/01/4741 12.3  11.5 - 15.5 % Final   Platelets 09/08/2021 233  150 - 400 K/uL Final   nRBC 09/08/2021 0.0  0.0 - 0.2 % Final   Neutrophils Relative % 09/08/2021 54  % Final   Neutro Abs 09/08/2021 4.3  1.7 - 7.7 K/uL Final   Lymphocytes Relative 09/08/2021 36  % Final   Lymphs Abs 09/08/2021 2.8  0.7 - 4.0 K/uL Final   Monocytes Relative 09/08/2021 7  % Final   Monocytes Absolute 09/08/2021 0.6  0.1 - 1.0 K/uL Final   Eosinophils Relative 09/08/2021 2  % Final   Eosinophils Absolute 09/08/2021 0.1  0.0 - 0.5 K/uL Final   Basophils Relative 09/08/2021 1  % Final   Basophils Absolute 09/08/2021 0.0  0.0 - 0.1 K/uL Final   Immature Granulocytes 09/08/2021 0  % Final    Abs Immature Granulocytes 09/08/2021 0.03  0.00 - 0.07 K/uL Final   Performed at [facility], [address], [place], [place] [address]   Sodium 09/08/2021 138  135 - 145 mmol/L Final   Potassium 09/08/2021 3.6  3.5 - 5.1 mmol/L Final   Chloride 09/08/2021 104  98 - 111 mmol/L Final   CO2 09/08/2021 26  22 - 32 mmol/L Final   Glucose, Bld 09/08/2021 101 (A)  70 - 99 mg/dL Final   Glucose reference range applies only to samples taken after fasting for at least 8 hours.   BUN 09/08/2021 11  6 - 20 mg/dL Final   Creatinine, Ser 09/08/2021 0.78  0.61 - 1.24 mg/dL Final   Calcium 87/56/4332 9.4  8.9 - 10.3 mg/dL Final   Total Protein 95/18/8416 6.4 (A)  6.5 - 8.1 g/dL Final   Albumin 60/63/0160 4.1  3.5 - 5.0 g/dL Final   AST 10/93/2355 26  15 - 41 U/L Final   ALT 09/08/2021 39  0 - 44 U/L Final   Alkaline Phosphatase 09/08/2021 89  38 - 126 U/L Final   Total Bilirubin 09/08/2021 1.7 (A)  0.3 - 1.2 mg/dL Final   GFR, Estimated 09/08/2021 >60  >60 mL/min Final   Comment: (NOTE) Calculated using the CKD-EPI Creatinine Equation (2021)    Anion gap 09/08/2021 8  5 - 15 Final   Performed at [facility], [address] [place], [place] [address]   Hgb A1c MFr Bld 09/08/2021 5.4  4.8 - 5.6 % Final   Comment: (NOTE) Pre diabetes:          5.7%-6.4%  Diabetes:              >6.4%  Glycemic control for   <7.0% adults with diabetes    Mean Plasma Glucose 09/08/2021 108.28  mg/dL Final   Performed at [facility], [address] [place], [place] [address]   TSH 09/08/2021 1.198  0.350 - 4.500 uIU/mL Final   Comment: Performed by a 3rd Generation assay with a functional sensitivity of <=0.01 uIU/mL. Performed  at [facility], [address] [place], [place] [address]    POC Amphetamine UR 09/08/2021 None Detected  NONE DETECTED (Cut Off Level 1000 ng/mL) Preliminary   POC Secobarbital (BAR) 09/08/2021 None Detected  NONE DETECTED (Cut Off Level 300 ng/mL) Preliminary    POC Buprenorphine (BUP) 09/08/2021 None Detected  NONE DETECTED (Cut Off Level 10 ng/mL) Preliminary   POC Oxazepam (BZO) 09/08/2021 None Detected  NONE DETECTED (Cut Off Level 300 ng/mL) Preliminary   POC Cocaine UR 09/08/2021 None Detected  NONE DETECTED (Cut Off Level 300 ng/mL) Preliminary   POC Methamphetamine UR 09/08/2021 None Detected  NONE DETECTED (Cut Off Level 1000 ng/mL) Preliminary   POC Morphine 09/08/2021 None Detected  NONE DETECTED (Cut Off Level 300 ng/mL) Preliminary   POC Oxycodone UR 09/08/2021 None Detected  NONE DETECTED (Cut Off Level 100 ng/mL) Preliminary   POC Methadone UR 09/08/2021 None Detected  NONE DETECTED (Cut Off Level 300 ng/mL) Preliminary   POC Marijuana UR 09/08/2021 None Detected  NONE DETECTED (Cut Off Level 50 ng/mL) Preliminary   Cholesterol 09/08/2021 151  0 - 200 mg/dL Final   Triglycerides 62/13/0865 57  <150 mg/dL Final   HDL 78/46/9629 36 (A)  >40 mg/dL Final   Total CHOL/HDL Ratio 09/08/2021 4.2  RATIO Final   VLDL 09/08/2021 11  0 - 40 mg/dL Final   LDL Cholesterol 09/08/2021 104 (A)  0 - 99 mg/dL Final   Comment:        Total Cholesterol/HDL:CHD Risk Coronary Heart Disease Risk Table                     Men   Women  1/2 Average Risk   3.4   3.3  Average Risk       5.0   4.4  2 X Average Risk   9.6   7.1  3 X Average Risk  23.4   11.0        Use the calculated Patient Ratio above and the CHD Risk Table to determine the patient's CHD Risk.        ATP III CLASSIFICATION (LDL):  <100     mg/dL   Optimal  528-413  mg/dL   Near or Above                    Optimal  130-159  mg/dL   Borderline  244-010  mg/dL   High  >272     mg/dL   Very High Performed at [facility], [address] [place], [place] [address]   Hospital Outpatient Visit on 09/07/2021  Component Date Value Ref Range Status   SARS Coronavirus 2 by RT PCR 09/07/2021 NEGATIVE  NEGATIVE Final   Comment: (NOTE) SARS-CoV-2 target nucleic acids are NOT  DETECTED.  The SARS-CoV-2 RNA is generally detectable in upper respiratory specimens during the acute phase of infection. The lowest concentration of SARS-CoV-2 viral copies this assay can detect is 138 copies/mL. A negative result does not preclude SARS-Cov-2 infection and should not be used as the sole basis for treatment or other patient management decisions. A negative result may occur with  improper specimen collection/handling, submission of specimen other than nasopharyngeal swab, presence of viral mutation(s) within the areas targeted by this assay, and inadequate number of viral copies(<138 copies/mL). A negative result must be combined with clinical observations, patient history, and epidemiological information. The expected result is Negative.  Fact Sheet for Patients:  BloggerCourse.com  Fact Sheet for Healthcare Providers:  SeriousBroker.it  This test is no                          t yet approved or cleared by the [place] FDA and  has been authorized for detection and/or diagnosis of SARS-CoV-2 by FDA under an Emergency Use Authorization (EUA). This EUA will remain  in effect (meaning this test can be used) for the duration of the COVID-19 declaration under Section 564(b)(1) of the Act, 21 U.S.C.section 360bbb-3(b)(1), unless the authorization is terminated  or revoked sooner.       Influenza A by PCR 09/07/2021 NEGATIVE  NEGATIVE Final   Influenza B by PCR 09/07/2021 NEGATIVE  NEGATIVE Final   Comment: (NOTE) The Xpert Xpress SARS-CoV-2/FLU/RSV plus assay is intended as an aid in the diagnosis of influenza from Nasopharyngeal swab specimens and should not be used as a sole basis for treatment. Nasal washings and aspirates are unacceptable for Xpert Xpress SARS-CoV-2/FLU/RSV testing.  Fact Sheet for Patients: BloggerCourse.com  Fact Sheet for Healthcare  Providers: SeriousBroker.it  This test is not yet approved or cleared by the [place] FDA and has been authorized for detection and/or diagnosis of SARS-CoV-2 by FDA under an Emergency Use Authorization (EUA). This EUA will remain in effect (meaning this test can be used) for the duration of the COVID-19 declaration under Section 564(b)(1) of the Act, 21 U.S.C. section 360bbb-3(b)(1), unless the authorization is terminated or revoked.  Performed at [facility], [address] [place], [place] [address]   Admission on 09/07/2021, Discharged on 09/07/2021  Component Date Value Ref Range Status   Color, Urine 09/07/2021 AMBER (A)  YELLOW Final   BIOCHEMICALS MAY BE AFFECTED BY COLOR   APPearance 09/07/2021 HAZY (A)  CLEAR Final   Specific Gravity, Urine 09/07/2021 1.033 (A)  1.005 - 1.030 Final   pH 09/07/2021 5.0  5.0 - 8.0 Final   Glucose, UA 09/07/2021 NEGATIVE  NEGATIVE mg/dL Final   Hgb urine dipstick 09/07/2021 NEGATIVE  NEGATIVE Final   Bilirubin Urine 09/07/2021 NEGATIVE  NEGATIVE Final   Ketones, ur 09/07/2021 80 (A)  NEGATIVE mg/dL Final   Protein, ur 33/82/5053 30 (A)  NEGATIVE mg/dL Final   Nitrite 97/67/3419 NEGATIVE  NEGATIVE Final   Leukocytes,Ua 09/07/2021 NEGATIVE  NEGATIVE Final   RBC / HPF 09/07/2021 0-5  0 - 5 RBC/hpf Final   WBC, UA 09/07/2021 0-5  0 - 5 WBC/hpf Final   Bacteria, UA 09/07/2021 RARE (A)  NONE SEEN Final   Squamous Epithelial / LPF 09/07/2021 0-5  0 - 5 Final   Mucus 09/07/2021 PRESENT   Final   Performed at [facility], [address], [place], [place] [address]    Allergies: Patient has no known allergies.  PTA Medications: (Not in a hospital admission)   Medical Decision Making  Based on my evaluation, patient does not appear to be an immediate danger to himself, however, patient is requesting resources for housing. Patient is also homeless and is unsure if he would be allowed back  at home. Provider recommends overnight observation for the patient as well as consultation with social work in the morning.    Recommendations  Based on my evaluation the patient does not appear to have an emergency medical condition.  Provider recommends overnight observation for the patient as well as consultation with social work in the morning  Meta Hatchet, [place] 09/08/21  6:58  AM

## 2021-09-08 NOTE — ED Notes (Signed)
This nurse advised pt per the provider (eddy) that he will have to wait until the social worker comes in this morning to speak with them about his concerns that he told the provider about last night. The patient was also advised that if his d/c at this time that he will not be able to speak with the social worker. Pt agreed to stay so that he could speak with a  Child psychotherapist. Pt then asked for a snack. Pt was given a muffin and milk.

## 2021-09-08 NOTE — ED Notes (Signed)
Pt calm, cooperative with staff. Denies SI/HI/AVH. Pt requesting to leave today. Notified NP in secure chat of pt's request. Informed pt to notify staff with any needs or concerns. Safety maintained.

## 2021-09-08 NOTE — ED Provider Notes (Signed)
FBC/OBS ASAP Discharge Summary  Date and Time: 09/08/2021 8:51 AM  Name: Cameron Cruz  MRN:  [id number]   Discharge Diagnoses:  Final diagnoses:  Episode of recurrent major depressive disorder, unspecified depression episode severity (HCC)  Unsheltered homelessness    Subjective: "I'm ready to go.  I just need list of shelters and resources."  patient presented to [facility] as a walk in with complaints of homelessness and seeking resources  Cameron Cruz, 30 y.o., male patient seen face to face by this provider, consulted with Dr. Earlene Plater; and chart reviewed on 09/08/21.  On evaluation Cameron Cruz reports he came in to get resources for homelessness.  Patient denies suicidal/self-harm/homicidal ideations, psychosis, paranoia.  Patient reports he is employed at [employer] and is seeking shelter close to his job. During evaluation Cameron Cruz is standing in no acute distress.  He is alert, oriented x 4, calm, cooperative and attentive.  His mood is euthymic with congruent affect.  He has normal speech, and behavior.  Objectively there is no evidence of psychosis/mania or delusional thinking.  Patient is able to converse coherently, goal directed thoughts, no distractibility, or pre-occupation.  He also denies suicidal/self-harm/homicidal ideation, psychosis, and paranoia.  Patient answered question appropriately.     Stay Summary: Cameron Cruz was admitted to [facility] Continuous Assessment unit for Unsheltered homelessness and crisis management.  He was restarted on home medications.  No prescriptions will be provided.  Patient was only an continuous assessment for a few hours awaiting social work assistance.   Cameron Cruz was evaluated for stability and plans for continued recovery upon discharge. The following was addressed as part of his discharge planning and follow up treatment:  Employment, housing, transportation, bed availability, health status, family  support, and any pending legal issues were also considered during his needed during the continuous assessment/observation. He was also offered further treatment options upon discharge including but not limited to Residential, Intensive Outpatient, Outpatient treatment, Rehabilitation services, and resources for shelters and Half-way-house if needed.  Cameron Cruz will follow up with the services as listed below under Follow up Information.    Upon completion of this admission the Cameron Cruz was both mentally and medically stable for discharge denying suicidal/homicidal ideation, auditory/visual/tactile hallucinations, delusional thoughts and paranoia.     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 No pertinent surgical history. Family History:  Family History  Family history unknown: Yes   Family Psychiatric History: None noted Social History:  Social History   Substance and Sexual Activity  Alcohol Use Yes   Comment: occasionally     Social History   Substance and Sexual Activity  Drug Use Never    Social History   Socioeconomic History   Marital status: Single    Spouse name: Not on file   Number of children: Not on file   Years of education: Not on file   Highest education level: Not on file  Occupational History   Not on file  Tobacco Use   Smoking status: Every Day    Packs/day: 1.00    Years: 5.00    Pack years: 5.00    Types: Cigarettes   Smokeless tobacco: Never  Vaping Use   Vaping Use: Never used  Substance and Sexual Activity  Alcohol use: Yes    Comment: occasionally   Drug use: Never   Sexual activity: Yes  Other Topics Concern   Not on file  Social History Narrative   Not on file   Social Determinants of Health   Financial Resource Strain: Not on file  Food  Insecurity: Not on file  Transportation Needs: Not on file  Physical Activity: Not on file  Stress: Not on file  Social Connections: Not on file   SDOH:  SDOH Screenings   Alcohol Screen: Not on file  Depression (PHQ2-9): Not on file  Financial Resource Strain: Not on file  Food Insecurity: Not on file  Housing: Not on file  Physical Activity: Not on file  Social Connections: Not on file  Stress: Not on file  Tobacco Use: High Risk   Smoking Tobacco Use: Every Day   Smokeless Tobacco Use: Never   Passive Exposure: Not on file  Transportation Needs: Not on file    Tobacco Cessation:  A prescription for an FDA-approved tobacco cessation medication provided at discharge  Current Medications:  Current Facility-Administered Medications  Medication Dose Route Frequency Provider Last Rate Last Admin   acetaminophen (TYLENOL) tablet 650 mg  650 mg Oral Q6H PRN Nwoko, Uchenna E, PA       alum & mag hydroxide-simeth (MAALOX/MYLANTA) 200-200-20 MG/5ML suspension 30 mL  30 mL Oral Q4H PRN Nwoko, Uchenna E, PA       hydrOXYzine (ATARAX/VISTARIL) tablet 25 mg  25 mg Oral TID PRN Nwoko, Uchenna E, PA       magnesium hydroxide (MILK OF MAGNESIA) suspension 30 mL  30 mL Oral Daily PRN Nwoko, Uchenna E, PA       traZODone (DESYREL) tablet 50 mg  50 mg Oral QHS PRN Nwoko, Uchenna E, PA       Current Outpatient Medications  Medication Sig Dispense Refill   cyclobenzaprine (FLEXERIL) 10 MG tablet Take 1 tablet (10 mg total) by mouth 2 (two) times daily as needed for muscle spasms. 20 tablet 0   metroNIDAZOLE (FLAGYL) 500 MG tablet Take 1 tablet (500 mg total) by mouth 2 (two) times daily. 14 tablet 0   naproxen (NAPROSYN) 375 MG tablet Take 1 tablet (375 mg total) by mouth 2 (two) times daily. 20 tablet 0   ondansetron (ZOFRAN ODT) 4 MG disintegrating tablet Take 1 tablet (4 mg total) by mouth every 8 (eight) hours as needed for nausea or vomiting. 20 tablet 0    PTA Medications: (Not in a  hospital admission)   Musculoskeletal  Strength & Muscle Tone: within normal limits Gait & Station: normal Patient leans: N/A  Psychiatric Specialty Exam  Presentation  General Appearance: Appropriate for Environment  Eye Contact:Good  Speech:Clear and Coherent; Normal Rate  Speech Volume:Normal  Handedness:Right   Mood and Affect  Mood:Euthymic  Affect:Appropriate; Congruent   Thought Process  Thought Processes:Coherent; Goal Directed; Other (comment)  Descriptions of Associations:Intact  Orientation:Full (Time, Place and Person)  Thought Content:WDL; Logical     Hallucinations:Hallucinations: None  Ideas of Reference:None  Suicidal Thoughts:Suicidal Thoughts: No  Homicidal Thoughts:Homicidal Thoughts: No   Sensorium  Memory:Immediate Good; Recent Good; Remote Good  Judgment:Intact  Insight:Good   Executive Functions  Concentration:Good  Attention Span:Good  Recall:Good  Fund of Knowledge:Good  Language:Good   Psychomotor Activity  Psychomotor Activity:Psychomotor Activity: Normal   Assets  Assets:Communication Skills; Desire for Improvement; Physical Health; Social Support; Vocational/Educational   Sleep  Sleep:Sleep: Good   Nutritional Assessment (For OBS and  FBC admissions only) Has the patient had a weight loss or gain of 10 pounds or more in the last 3 months?: No Has the patient had a decrease in food intake/or appetite?: No Does the patient have dental problems?: No Does the patient have eating habits or behaviors that may be indicators of an eating disorder including binging or inducing vomiting?: No Has the patient recently lost weight without trying?: 0 Has the patient been eating poorly because of a decreased appetite?: 0 Malnutrition Screening Tool Score: 0    Physical Exam  Physical Exam Vitals and nursing note reviewed. Exam conducted with a chaperone present.  Constitutional:      General: He is not in acute  distress.    Appearance: Normal appearance. He is not ill-appearing.  Cardiovascular:     Rate and Rhythm: Normal rate.  Pulmonary:     Effort: Pulmonary effort is normal.  Musculoskeletal:        General: Normal range of motion.     Cervical back: Normal range of motion.  Skin:    General: Skin is warm and dry.  Neurological:     Mental Status: He is alert and oriented to person, place, and time.  Psychiatric:        Attention and Perception: Attention and perception normal. He does not perceive auditory or visual hallucinations.        Mood and Affect: Mood and affect normal.        Speech: Speech normal.        Behavior: Behavior normal. Behavior is cooperative.        Thought Content: Thought content normal. Thought content does not include homicidal or suicidal ideation.        Cognition and Memory: Cognition and memory normal.        Judgment: Judgment normal.   Review of Systems  Constitutional: Negative.   HENT: Negative.    Eyes: Negative.   Respiratory: Negative.    Cardiovascular: Negative.   Gastrointestinal: Negative.   Genitourinary: Negative.   Musculoskeletal: Negative.   Skin: Negative.   Neurological: Negative.   Endo/Heme/Allergies: Negative.   Psychiatric/Behavioral:  Negative for depression, hallucinations, memory loss, substance abuse and suicidal ideas. The patient is not nervous/anxious.   Blood pressure (!) 121/94, pulse (!) 102, temperature 98.1 F (36.7 C), temperature source Oral, resp. rate 18, SpO2 98 %. There is no height or weight on file to calculate BMI.  Demographic Factors:  Male and Caucasian  Loss Factors: homeless  Historical Factors: NA  Risk Reduction Factors:   Sense of responsibility to family, Religious beliefs about death, Employed, and Positive social support  Continued Clinical Symptoms:  Stress over homelessness  Cognitive Features That Contribute To Risk:  None    Suicide Risk:  Minimal: No identifiable suicidal  ideation.  Patients presenting with no risk factors but with morbid ruminations; may be classified as minimal risk based on the severity of the depressive symptoms  Plan Of Care/Follow-up recommendations:  Activity:  Regular activity  Disposition: No evidence of imminent risk to self or others at present.   Patient does not meet criteria for psychiatric inpatient admission. Supportive therapy provided about ongoing stressors. Discussed crisis plan, support from social network, calling 911, coming to the Emergency Department, and calling Suicide Hotline.   Follow-up Information     [facility] .   Specialty: Urgent Care Why: New Therapy walkin:  Monday-Wednesday from 7:30am-12:30pm.  New Medication management walkin Monday-Friday from 7:30  am to 11:00am.  Patient will be taken in the order that they come.  You may not be seen on the same day as walkin. first come first Designer, jewellery information: [address] [place] [address] [phone]        [facility] Follow up.   Why: Please utilize this agency for resources for homeless services. Contact information: [address] [place], [place] [address] [phone]               Handout resources also given   Assunta Found, NP 09/08/2021, 8:51 AM

## 2021-09-08 NOTE — ED Notes (Signed)
Pt has woken up and is asking to leave. This nurse advised the patient that he would have to speak with the provider and be d/c by them in order to leave. Advised the patient that the next provider wont be here until after 7

## 2021-09-10 ENCOUNTER — Telehealth (HOSPITAL_COMMUNITY): Payer: Self-pay

## 2021-09-10 NOTE — BH Assessment (Signed)
Care Management - Follow Up [facility] Discharges   Writer attempted to make contact with patient today and was unsuccessful.  Writer left a HIPPA compliant voice message.   Per chart review, patient was provided with outpatient resources.  

## 2021-09-21 ENCOUNTER — Other Ambulatory Visit: Payer: Self-pay

## 2021-09-21 ENCOUNTER — Encounter (HOSPITAL_COMMUNITY): Payer: Self-pay | Admitting: Emergency Medicine

## 2021-09-21 ENCOUNTER — Emergency Department (HOSPITAL_COMMUNITY)
Admission: EM | Admit: 2021-09-21 | Discharge: 2021-09-21 | Disposition: A | Discharge status: Home / Self Care | Payer: Self-pay | Attending: Emergency Medicine | Admitting: Emergency Medicine

## 2021-09-21 DIAGNOSIS — G8929 Other chronic pain: Secondary | ICD-10-CM | POA: Insufficient documentation

## 2021-09-21 DIAGNOSIS — M545 Low back pain, unspecified: Secondary | ICD-10-CM | POA: Insufficient documentation

## 2021-09-21 DIAGNOSIS — F17210 Nicotine dependence, cigarettes, uncomplicated: Secondary | ICD-10-CM | POA: Insufficient documentation

## 2021-09-21 MED ORDER — ACETAMINOPHEN 325 MG PO TABS
650.0000 mg | ORAL_TABLET | Freq: Once | ORAL | Status: AC
  Administered 2021-09-21: 650 mg via ORAL
  Filled 2021-09-21: qty 2

## 2021-09-21 NOTE — ED Notes (Signed)
Pt verbalized understanding of d/c instructions, meds, and followup care. Denies questions. VSS, no distress noted. Steady gait to exit with all belongings. Given bagged lunch and shown exit.

## 2021-09-21 NOTE — ED Triage Notes (Signed)
Patient here with back pain, states that it has not gotten better since the last time he was here.  Denies any urinary symptoms.  Patient also having bilateral feet pain.
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
## 2021-09-21 NOTE — Discharge Instructions (Addendum)
Take tylenol or motrin. Follow up at the [facility] for social work needs. SEEK IMMEDIATE MEDICAL ATTENTION IF: New numbness, tingling, weakness, or problem with the use of your arms or legs.  Severe back pain not relieved with medications.  Change in bowel or bladder control.  Increasing pain in any areas of the body (such as chest or abdominal pain).  Shortness of breath, dizziness or fainting.  Nausea (feeling sick to your stomach), vomiting, fever, or sweats.

## 2021-09-27 ENCOUNTER — Inpatient Hospital Stay: Payer: Self-pay | Admitting: Internal Medicine

## 2021-10-08 ENCOUNTER — Encounter (HOSPITAL_COMMUNITY): Payer: Self-pay | Admitting: Radiology

## 2021-10-13 ENCOUNTER — Inpatient Hospital Stay: Payer: Self-pay | Admitting: Physician Assistant

## 2021-10-13 NOTE — Progress Notes (Deleted)
Patient ID: Cameron Cruz, male   DOB: 1991-04-09, 30 y.o.   MRN: [id number]     After ED visit for back pain 09/21/2021  From ED visit: Cameron Cruz is a 30 y.o. male who presents emergency department with chief complaint of back pain.  This has been an ongoing issue.  Patient was seen on 25 October diagnosed with a pulmonary nodule and declined CT scan at that time.  He also declines a CT scan today.  He denies any red flag symptoms such as weakness, bladder or bowel incontinence.  Pain is worse in the low back with movement.  He has not taken any medications to treat it.  Patient is a daily smoker.  He would like "a voucher to get into a shelter."  Pt rx medications at last visit, but could not afford them.  Patient here with back pain.  He does not wish to get a CT scan today.  I discussed reasons why we would get one however patient declines.  He states that he wants to go outside and smoke a cigarette.  I discussed with the patient that we would unfortunately not be able to place him into a shelter however I would be able to give him resources for shelters.  I have also recommended that he follow-up at the [facility] where they have active case managers and social work available.  Patient has no red flag symptoms and appears otherwise appropriate for discharge.  I have suggested that he try obtaining Motrin and Tylenol which is also available at the [facility] store.

## 2021-11-27 ENCOUNTER — Other Ambulatory Visit: Payer: Self-pay

## 2021-11-27 ENCOUNTER — Ambulatory Visit (HOSPITAL_COMMUNITY)
Admission: EM | Admit: 2021-11-27 | Discharge: 2021-11-27 | Disposition: A | Discharge status: Home / Self Care | Payer: Self-pay | Attending: Student | Admitting: Student

## 2021-11-27 ENCOUNTER — Encounter (HOSPITAL_COMMUNITY): Payer: Self-pay | Admitting: *Deleted

## 2021-11-27 DIAGNOSIS — F17200 Nicotine dependence, unspecified, uncomplicated: Secondary | ICD-10-CM

## 2021-11-27 DIAGNOSIS — B349 Viral infection, unspecified: Secondary | ICD-10-CM

## 2021-11-27 LAB — POC INFLUENZA A AND B ANTIGEN (URGENT CARE ONLY)
INFLUENZA A ANTIGEN, POC: NEGATIVE
INFLUENZA B ANTIGEN, POC: NEGATIVE

## 2021-11-27 NOTE — ED Triage Notes (Signed)
Pt reports all he needs is a work note.
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
## 2021-11-27 NOTE — Discharge Instructions (Signed)
-  With a virus, you're typically contagious for 5-7 days, or as long as you're having fevers.  ° °

## 2021-12-19 ENCOUNTER — Emergency Department (HOSPITAL_COMMUNITY)
Admission: EM | Admit: 2021-12-19 | Discharge: 2021-12-19 | Disposition: A | Discharge status: Home / Self Care | Payer: Self-pay | Attending: Emergency Medicine | Admitting: Emergency Medicine

## 2021-12-19 ENCOUNTER — Other Ambulatory Visit: Payer: Self-pay

## 2021-12-19 DIAGNOSIS — Y9301 Activity, walking, marching and hiking: Secondary | ICD-10-CM | POA: Insufficient documentation

## 2021-12-19 DIAGNOSIS — M79672 Pain in left foot: Secondary | ICD-10-CM | POA: Insufficient documentation

## 2021-12-19 DIAGNOSIS — M79671 Pain in right foot: Secondary | ICD-10-CM | POA: Insufficient documentation

## 2021-12-19 DIAGNOSIS — Z5321 Procedure and treatment not carried out due to patient leaving prior to being seen by health care provider: Secondary | ICD-10-CM | POA: Insufficient documentation

## 2021-12-19 NOTE — ED Notes (Addendum)
Pt spoke on the phone with his mother then walked out of the ER. Pt was asked if he was leaving and he did not give an answer. Pt was nowhere to be found when called for a room.

## 2021-12-19 NOTE — ED Triage Notes (Signed)
Pt here via [facility] from [address] for bilateral foot pain. Pt states he walked from [place] to [employer], has been told in the past he has flat feet and needs inserts, pt does not have any

## 2021-12-19 NOTE — ED Notes (Signed)
Pt refused X-ray

## 2022-05-02 ENCOUNTER — Ambulatory Visit (HOSPITAL_COMMUNITY)
Admission: EM | Admit: 2022-05-02 | Discharge: 2022-05-02 | Disposition: A | Discharge status: Home / Self Care | Payer: Self-pay | Attending: Emergency Medicine | Admitting: Emergency Medicine

## 2022-05-02 ENCOUNTER — Encounter (HOSPITAL_COMMUNITY): Payer: Self-pay | Admitting: Emergency Medicine

## 2022-05-02 DIAGNOSIS — L0501 Pilonidal cyst with abscess: Secondary | ICD-10-CM

## 2022-05-02 MED ORDER — DOXYCYCLINE MONOHYDRATE 100 MG PO CAPS: 100.0000 mg | ORAL_CAPSULE | Freq: Two times a day (BID) | ORAL | 0 refills | Status: DC

## 2022-05-02 NOTE — ED Triage Notes (Signed)
Pt is present today with concerns of tail bone pain. Pt states that he noticed it x2 days ago

## 2022-05-02 NOTE — Discharge Instructions (Addendum)
Take doxycyline twice a day for 7 days  Hold warm-hot compresses to affected area at least 4 times a day, this helps to facilitate draining, the more the better  You may use over-the-counter lidocaine over the affected area to temporarily provide a numbing effect  May use Tylenol 500 to 1000 mg every 6 hours and/or ibuprofen 800 mg every 8 hours  Please return for evaluation for increased swelling, increased tenderness or pain, non healing site, non draining site, you begin to have fever or chills   We reviewed the etiology of recurrent abscesses of skin.  Skin abscesses are collections of pus within the dermis and deeper skin tissues. Skin abscesses manifest as painful, tender, fluctuant, and erythematous nodules, frequently surmounted by a pustule and surrounded by a rim of erythematous swelling.  Spontaneous drainage of purulent material may occur.  Fever can occur on occasion.    -Skin abscesses can develop in healthy individuals with no predisposing conditions other than skin or nasal carriage of Staphylococcus aureus.  Individuals in close contact with others who have active infection with skin abscesses are at increased risk which is likely to explain why twin brother has similar episodes.   In addition, any process leading to a breach in the skin barrier can also predispose to the development of a skin abscesses, such as atopic dermatitis.
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
## 2022-06-18 ENCOUNTER — Ambulatory Visit (HOSPITAL_COMMUNITY)
Admission: EM | Admit: 2022-06-18 | Discharge: 2022-06-18 | Disposition: A | Discharge status: Home / Self Care | Payer: No Payment, Other

## 2022-06-18 DIAGNOSIS — Z789 Other specified health status: Secondary | ICD-10-CM

## 2022-06-18 NOTE — Discharge Instructions (Addendum)

## 2022-06-18 NOTE — BH Assessment (Signed)
Patient is a 31 year old male that presents voluntary this date with his friend he calls "his mom" Cameron Cruz) with whom he resides. Patient denies any S/I, H/I or AVH. Patient denies any SA use or history of a mental health disorder although states he "thinks he was diagnosed with ADHD" back in high school. Patient reports he was briefly on Adderall at that time but since then (over 15 years ago) patient has not been prescribed any medications for symptom management. "His mom" (who is not his biological mother) states patient has been residing with her for the last 3 years. Cameron Cruz states she "saw him standing in front of the [employer]" in the [place] area and found out he was homeless. Cameron Cruz states he has been residing with her since then although she has currently given him notice that he needs to relocate in the next 30 days because he can't hold a job and contribute to the household. 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 Patient is soon dismissed after a few days or "even hours" because he can't stay on task and focus. Patient is wanting to be provided with resources to assist with being diagnosed with "whatever he has" so he can "get on the right medications" so he can acquire a job. It is this writer's opinion that patient is LD, has a processing disorder or mild intellectual disability. Patient states he "never made it through high school" and is slow to process this writer's questions.

## 2022-06-18 NOTE — ED Notes (Signed)
Patient was discharged by the provider and AVS was given to patient with community resources.

## 2022-06-18 NOTE — ED Provider Notes (Signed)
[facility] Medical Screening Exam  Patient Name: Cameron Cruz MRN: [id number] Date of Evaluation: 06/18/22 Chief Complaint:   Diagnosis:  Final diagnoses:  Needs assistance with community resources   History of Present illness: Cameron Cruz is a 31 y.o. male. Pt presents voluntarily to [facility] behavioral health for walk-in assessment.  Pt is accompanied by Darden Palmer, who he is currently living with. Nedra Hai remains throughout the assessment as per pt request and w/ pt verbal consent. Pt is assessed face-to-face by nurse practitioner.   [name], 31 y.o., male patient seen face to face by this provider, consulted with Dr. Lucianne Muss; and chart reviewed on 06/18/22.  On evaluation Cameron Cruz reports he is presenting today to "get an assessment". He states he is interested in getting a mental health diagnosis. He states he wonders if he is depressed. He describes his mood can be "iffy". States "first I'm happy and next minute I'm going off". He states would like to get set up with outpatient mental health services.   Pt reports current euthymic mood. He reports good appetite and sleep.   He denies current/hx of suicidal ideations, homicidal ideations or violent ideations.   Pt denies hx of NSSI, SA, or inpatient psychiatric evaluation.  Pt denies current/hx of AVH, paranoia.  Pt states he is not aware of family psychiatric hx.   Pt states he is not connected w/ medication management or counseling. He states in the past he has been dx'd w/ ADHD. He was rx'd Adderall, Wellbutrin, and Trazodone in the past. He states he does not know if they were effective for him.   He states he is currently living w/ Nedra Hai. He and Nedra Hai deny there is a firearm in the home.   Pt reports he is currently unemployed. Nedra Hai states pt has had 12 jobs in the past year. Per Nedra Hai, pt is "not focused". She states pt "lets little things get to him".  Pt reports highest level of  education was the 11th grade. He states he stopped attending school a few weeks prior to graduation. When asked if pt had an IEP during school, he reports he was in "special ed". Pt is not sure if he was ever dx'd w/ a learning or developmental disability.   Nedra Hai states she does not have safety concerns w/ pt discharge today. She states she does not believe pt is danger to himself or to others. To her knowledge, he has never attempted to kill/hurt himself or anyone else. She states pt is "not good at taking tests, not able to get a drivers license".  She states pt has challenges with reading.  Discussed w/ pt and Nedra Hai resources available to him, which were also provided in discharge paperwork. Pt states he does not have a primary care provider. Discussed establishing services and maintaining annual or as needed check ups. Discussed w/ pt that his blood pressure is high today 143/95 and recommended follow up. Pt and Nedra Hai deny any concerns w/ discharge today.  During evaluation Cameron Cruz is sitting in no acute distress, non-toxic appearing.  He is alert, oriented x 4, calm, cooperative and attentive. He maintains good eye contact. Speech is clear and coherent, w/ nml rate and volume. Reported mood is euthymic. Affect is appropriate, congruent, full range. TP is coherent, goal directed, linear. TC is logical. There is no evidence of responding to internal stimuli, agitation, aggression or distractibility. No delusions or paranoia elicited. Patient answered question  appropriately.   Psychiatric Specialty Exam  Presentation  General Appearance:Appropriate for Environment; Casual; Fairly Groomed  Eye Contact:Good  Speech:Clear and Coherent; Normal Rate  Speech Volume:Normal  Handedness:Right   Mood and Affect  Mood:Euthymic  Affect:Appropriate; Congruent; Full Range   Thought Process  Thought Processes:Coherent; Goal Directed; Linear  Descriptions of  Associations:Intact  Orientation:Full (Time, Place and Person)  Thought Content:Logical    Hallucinations:None  Ideas of Reference:None  Suicidal Thoughts:No  Homicidal Thoughts:No   Sensorium  Memory:Immediate Good  Judgment:Fair  Insight:Fair   Executive Functions  Concentration:Fair  Attention Span:Good  Recall:Fair  Fund of Knowledge:Fair  Language:Good   Psychomotor Activity  Psychomotor Activity:Normal   Assets  Assets:Communication Skills; Desire for Improvement; Housing   Sleep  Sleep:Good  Number of hours: No data recorded  No data recorded  Physical Exam: Physical Exam Constitutional:      Appearance: Normal appearance.  Cardiovascular:     Rate and Rhythm: Normal rate.  Pulmonary:     Effort: Pulmonary effort is normal.  Neurological:     Mental Status: He is alert and oriented to person, place, and time.  Psychiatric:        Attention and Perception: Attention and perception normal.        Mood and Affect: Mood and affect normal.        Speech: Speech normal.        Behavior: Behavior normal. Behavior is cooperative.        Thought Content: Thought content normal.    Review of Systems  Constitutional:  Negative for chills and fever.  Respiratory:  Negative for shortness of breath.   Cardiovascular:  Negative for chest pain and palpitations.  Gastrointestinal:  Negative for diarrhea.  Neurological:  Negative for dizziness and headaches.  Psychiatric/Behavioral:  Negative for depression, hallucinations, substance abuse and suicidal ideas. The patient is not nervous/anxious.    Blood pressure (!) 143/95, pulse 87, temperature 98.2 F (36.8 C), temperature source Oral, resp. rate 18, SpO2 98 %. There is no height or weight on file to calculate BMI.  Musculoskeletal: Strength & Muscle Tone: within normal limits Gait & Station: normal Patient leans: N/A  [facility] MSE Discharge Disposition for Follow up and Recommendations: Based on  my evaluation the patient does not appear to have an emergency medical condition and can be discharged with resources and follow up care in outpatient services for Medication Management, Individual Therapy, and community resources  Lauree Chandler, NP 06/18/2022, 7:48 PM

## 2022-06-20 ENCOUNTER — Encounter (HOSPITAL_COMMUNITY): Payer: Self-pay

## 2022-06-20 ENCOUNTER — Encounter (HOSPITAL_COMMUNITY): Payer: Self-pay | Admitting: Licensed Clinical Social Worker

## 2022-06-20 ENCOUNTER — Ambulatory Visit (INDEPENDENT_AMBULATORY_CARE_PROVIDER_SITE_OTHER): Payer: No Payment, Other | Admitting: Licensed Clinical Social Worker

## 2022-06-20 DIAGNOSIS — F331 Major depressive disorder, recurrent, moderate: Secondary | ICD-10-CM | POA: Diagnosis not present

## 2022-06-20 DIAGNOSIS — F411 Generalized anxiety disorder: Secondary | ICD-10-CM | POA: Insufficient documentation

## 2022-06-20 NOTE — Plan of Care (Signed)
Pt agreeable to plan  ?
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
## 2022-06-24 ENCOUNTER — Other Ambulatory Visit: Payer: Self-pay

## 2022-06-24 ENCOUNTER — Other Ambulatory Visit (HOSPITAL_COMMUNITY): Payer: Self-pay

## 2022-06-24 ENCOUNTER — Encounter (HOSPITAL_COMMUNITY): Payer: Self-pay | Admitting: Psychiatry

## 2022-06-24 ENCOUNTER — Ambulatory Visit (INDEPENDENT_AMBULATORY_CARE_PROVIDER_SITE_OTHER): Payer: No Payment, Other | Admitting: Psychiatry

## 2022-06-24 ENCOUNTER — Other Ambulatory Visit (HOSPITAL_BASED_OUTPATIENT_CLINIC_OR_DEPARTMENT_OTHER): Payer: Self-pay

## 2022-06-24 DIAGNOSIS — F3181 Bipolar II disorder: Secondary | ICD-10-CM

## 2022-06-24 DIAGNOSIS — F411 Generalized anxiety disorder: Secondary | ICD-10-CM

## 2022-06-24 DIAGNOSIS — F17200 Nicotine dependence, unspecified, uncomplicated: Secondary | ICD-10-CM | POA: Diagnosis not present

## 2022-06-24 DIAGNOSIS — F901 Attention-deficit hyperactivity disorder, predominantly hyperactive type: Secondary | ICD-10-CM | POA: Diagnosis not present

## 2022-06-24 MED ORDER — ATOMOXETINE HCL 40 MG PO CAPS
40.0000 mg | ORAL_CAPSULE | Freq: Every day | ORAL | 3 refills | Status: DC
  Filled 2022-06-24 (×2): qty 30, 30d supply, fill #0
  Filled 2022-07-20 – 2022-07-25 (×2): qty 30, 30d supply, fill #1

## 2022-06-24 MED ORDER — HYDROXYZINE HCL 10 MG PO TABS
10.0000 mg | ORAL_TABLET | Freq: Three times a day (TID) | ORAL | 3 refills | Status: DC | PRN
  Filled 2022-06-24 (×2): qty 90, 30d supply, fill #0

## 2022-06-24 MED ORDER — ARIPIPRAZOLE 5 MG PO TABS
5.0000 mg | ORAL_TABLET | Freq: Every day | ORAL | 3 refills | Status: DC
  Filled 2022-06-24 (×2): qty 30, 30d supply, fill #0

## 2022-06-24 MED ORDER — NICOTINE 21 MG/24HR TD PT24
21.0000 mg | MEDICATED_PATCH | Freq: Every day | TRANSDERMAL | 3 refills | Status: DC
  Filled 2022-06-24 (×2): qty 28, 28d supply, fill #0
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
## 2022-06-27 ENCOUNTER — Other Ambulatory Visit (HOSPITAL_BASED_OUTPATIENT_CLINIC_OR_DEPARTMENT_OTHER): Payer: Self-pay

## 2022-06-27 ENCOUNTER — Telehealth (INDEPENDENT_AMBULATORY_CARE_PROVIDER_SITE_OTHER): Payer: No Payment, Other | Admitting: Licensed Clinical Social Worker

## 2022-06-27 DIAGNOSIS — F901 Attention-deficit hyperactivity disorder, predominantly hyperactive type: Secondary | ICD-10-CM | POA: Diagnosis not present

## 2022-06-27 DIAGNOSIS — F3181 Bipolar II disorder: Secondary | ICD-10-CM

## 2022-06-27 NOTE — Progress Notes (Signed)
   THERAPIST PROGRESS NOTE  Virtual Visit via Video Note  I connected with Cameron Cruz on 06/27/22 at  1:00 PM EDT by a video enabled telemedicine application and verified that I am speaking with the correct person using two identifiers.  Location: Patient: [facility]  Provider: Provider Home    I discussed the limitations of evaluation and management by telemedicine and the availability of in person appointments. The patient expressed understanding and agreed to proceed.     I discussed the assessment and treatment plan with the patient. The patient was provided an opportunity to ask questions and all were answered. The patient agreed with the plan and demonstrated an understanding of the instructions.   The patient was advised to call back or seek an in-person evaluation if the symptoms worsen or if the condition fails to improve as anticipated.  I provided 40 minutes of non-face-to-face time during this encounter.   Weber Cooks, LCSW   Participation Level: Active  Behavioral Response: CasualAlertAnxious and Depressed  Type of Therapy: Individual Therapy  Treatment Goals addressed: create 3 coping skills to decrease anxiety and depression   ProgressTowards Goals: Progressing  Interventions: CBT and Motivational Interviewing    Suicidal/Homicidal: Nowithout intent/plan  Therapist Response:    Pt was alert and oriented x 5. He was dressed casually and engaged well in therapy session. Pt presented with depressed and anxious mood/affect. He was pleasant, cooperative and maintained good eye contact.   Pt reports that his medications work to begin with but since the first week they have not worked as well. Pt reports seeing medication provider on Friday and medications were adjusted. LCSW advised pt to take new medications as prescribed for 6 weeks to understand full effectiveness. Another stressor for pt is the woman he is living with, who he calls his mother.  Pt reports poor coping skills for becoming angry, raising voices and throwing things. Pt reports that he is not provided space to be able to cool down.   Interventions/Plan: LCSW educated pt on coping skills such as exercising and "5,4,3,2,1 Grounding technique". Plan for pt to use at least 1 of the coping techniques 3 x weekly. LCSW used CBT therapy for framing and cognitive restructuring. PCSW used supportive language for praise and encouragement    Plan: Return again in 3 weeks.  Diagnosis: Bipolar 2 disorder, major depressive episode (HCC)  Attention deficit hyperactivity disorder (ADHD), predominantly hyperactive type  Collaboration of Care: Other None today   Patient/Guardian was advised Release of Information must be obtained prior to any record release in order to collaborate their care with an outside provider. Patient/Guardian was advised if they have not already done so to contact the registration department to sign all necessary forms in order for [place] to release information regarding their care.   Consent: Patient/Guardian gives verbal consent for treatment and assignment of benefits for services provided during this visit. Patient/Guardian expressed understanding and agreed to proceed.   Weber Cooks, LCSW 06/27/2022

## 2022-06-28 ENCOUNTER — Telehealth (HOSPITAL_COMMUNITY): Payer: Self-pay

## 2022-06-28 NOTE — BH Assessment (Signed)
Care Management - [facility] Follow Up Discharges   Writer attempted to make contact with patient today and was unsuccessful.  Phone just rang.    Per chart review, patient completed an appointment with Richardson Dopp, LCSW at [facility] on 06-20-22 and Toy Cookey, NP on 06-24-22

## 2022-07-05 ENCOUNTER — Telehealth (HOSPITAL_COMMUNITY): Payer: Self-pay | Admitting: *Deleted

## 2022-07-05 ENCOUNTER — Other Ambulatory Visit (HOSPITAL_COMMUNITY): Payer: Self-pay | Admitting: Psychiatry

## 2022-07-05 DIAGNOSIS — F3181 Bipolar II disorder: Secondary | ICD-10-CM

## 2022-07-05 DIAGNOSIS — F411 Generalized anxiety disorder: Secondary | ICD-10-CM

## 2022-07-05 MED ORDER — ARIPIPRAZOLE 10 MG PO TABS
10.0000 mg | ORAL_TABLET | Freq: Every day | ORAL | 3 refills | Status: DC
  Filled 2022-07-12: qty 30, 30d supply, fill #0
  Filled 2022-08-22: qty 30, 30d supply, fill #1

## 2022-07-05 NOTE — Telephone Encounter (Signed)
Patient called Requesting to speak with provider -- ??'s about his med's

## 2022-07-05 NOTE — Telephone Encounter (Signed)
Provider called patient and was informed that he has been more irritable.  He reports that the first few days he took his medications he was calm.  Patient asked writer to speak with his mother.  She reports that he has been angry, distractible, and irritable.  Today Abilify increased from 5 mg to 10 mg to help manage mood.  He will continue other medications as prescribed.  Provider encourage patient to take hydroxyzine as needed.  He endorsed understanding and agreed.  No other concerns noted at this time.

## 2022-07-11 ENCOUNTER — Other Ambulatory Visit (HOSPITAL_COMMUNITY): Payer: Self-pay

## 2022-07-12 ENCOUNTER — Other Ambulatory Visit (HOSPITAL_COMMUNITY): Payer: Self-pay

## 2022-07-13 ENCOUNTER — Ambulatory Visit (INDEPENDENT_AMBULATORY_CARE_PROVIDER_SITE_OTHER): Payer: No Payment, Other | Admitting: Licensed Clinical Social Worker

## 2022-07-13 DIAGNOSIS — F3181 Bipolar II disorder: Secondary | ICD-10-CM

## 2022-07-13 NOTE — Progress Notes (Signed)
THERAPIST PROGRESS NOTE  Virtual Visit via Video Note  I connected with Cameron Cruz on 07/13/22 at  2:00 PM EDT by a video enabled telemedicine application and verified that I am speaking with the correct person using two identifiers.  Location: Patient: [facility]  Provider: [facility]    I discussed the limitations of evaluation and management by telemedicine and the availability of in person appointments. The patient expressed understanding and agreed to proceed.      I discussed the assessment and treatment plan with the patient. The patient was provided an opportunity to ask questions and all were answered. The patient agreed with the plan and demonstrated an understanding of the instructions.   The patient was advised to call back or seek an in-person evaluation if the symptoms worsen or if the condition fails to improve as anticipated.  I provided 40 minutes of non-face-to-face time during this encounter.   Weber Cooks, LCSW  Participation Level: Active  Behavioral Response: CasualAlertAnxious and Depressed  Type of Therapy: Individual Therapy  Treatment Goals addressed: Decreased PHQ-9 below 810.  Decrease GAD-7 below a 5.  ProgressTowards Goals: Progressing  Interventions: CBT, Motivational Interviewing, and Supportive  Summary: Cameron Cruz is a 31 y.o. male who presents with depressed mood\affect.  Patient was pleasant, cooperative, maintained good eye contact.  He engaged well in therapy session was dressed casually.  Patient reports a decrease in overall symptoms for irritability, worthlessness, hopelessness, tension, and worry.  Patient states that this is as evidenced by decreased fighting with his mother who is currently living with and also maintaining part-time employment at [employer].  Patient reports that he has been taking medications for the past 2 weeks and attributes decrease in symptoms to his medication  management process.  Patient reports utilizing coping skills for breathing techniques such as a grounding technique he learned in therapy last session 4 "5, 4, 3, 2, 1 grounding technique".  And also taking walks.   Suicidal/Homicidal: Nowithout intent/plan  Therapist Response:    Intervention/Plan: Plan for patient is to continue utilizing coping skills for grounding techniques and exercise at least 2 times weekly.  LCSW used motivational interviewing for open-ended questions, positive affirmations, and reflective listening.  LCSW utilized language for unconditional positive regard, praise, and encouragement.  LCSW educated patient on the signs and symptoms of bipolar 2 disorder.  LCSW educated patient on taking medications as prescribed.  LCSW used solution focused therapy to explain process for getting prescription refills for controlled substances for Adderall and also for noncontrolled substances stating that patient could request refills for controlled substances 2 days prior to refills being needed and 5 days for noncontrolled substances.  LCSW administered a GAD-7.  LCSW administered a PHQ-9.  LCSW notes a decrease in both GAD-7 and PHQ-9.  LCSW reviewed scores with patient.   Plan: Return again in 3 weeks.  Diagnosis: Bipolar 2 disorder, major depressive episode (HCC)  Collaboration of Care: Other None reported today   Patient/Guardian was advised Release of Information must be obtained prior to any record release in order to collaborate their care with an outside provider. Patient/Guardian was advised if they have not already done so to contact the registration department to sign all necessary forms in order for [place] to release information regarding their care.   Consent: Patient/Guardian gives verbal consent for treatment and assignment of benefits for services provided during this visit. Patient/Guardian expressed understanding and agreed to proceed.   Madelaine Bhat  Sharlyn Bologna,  LCSW 07/13/2022

## 2022-07-20 ENCOUNTER — Other Ambulatory Visit (HOSPITAL_COMMUNITY): Payer: Self-pay

## 2022-07-25 ENCOUNTER — Other Ambulatory Visit (HOSPITAL_COMMUNITY): Payer: Self-pay

## 2022-08-02 ENCOUNTER — Ambulatory Visit (INDEPENDENT_AMBULATORY_CARE_PROVIDER_SITE_OTHER): Payer: No Payment, Other | Admitting: Licensed Clinical Social Worker

## 2022-08-02 DIAGNOSIS — F3181 Bipolar II disorder: Secondary | ICD-10-CM | POA: Diagnosis not present

## 2022-08-02 DIAGNOSIS — F411 Generalized anxiety disorder: Secondary | ICD-10-CM

## 2022-08-02 DIAGNOSIS — F901 Attention-deficit hyperactivity disorder, predominantly hyperactive type: Secondary | ICD-10-CM
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
## 2022-08-22 ENCOUNTER — Ambulatory Visit (HOSPITAL_COMMUNITY): Payer: No Payment, Other | Admitting: Licensed Clinical Social Worker

## 2022-08-22 ENCOUNTER — Other Ambulatory Visit (HOSPITAL_COMMUNITY): Payer: Self-pay

## 2022-08-23 ENCOUNTER — Telehealth (HOSPITAL_COMMUNITY): Payer: Self-pay | Admitting: Licensed Clinical Social Worker

## 2022-08-23 ENCOUNTER — Ambulatory Visit (HOSPITAL_COMMUNITY): Payer: Self-pay | Admitting: Licensed Clinical Social Worker

## 2022-08-23 NOTE — Telephone Encounter (Signed)
Sent two links to pt phone with no response. LCSW f/u with PC and left HIPAA compliant VM

## 2022-09-12 ENCOUNTER — Encounter (HOSPITAL_COMMUNITY): Payer: Self-pay

## 2022-09-16 ENCOUNTER — Telehealth (HOSPITAL_COMMUNITY): Payer: No Payment, Other | Admitting: Student in an Organized Health Care Education/Training Program

## 2022-09-19 ENCOUNTER — Telehealth (HOSPITAL_COMMUNITY): Payer: Self-pay | Admitting: Licensed Clinical Social Worker

## 2022-09-19 ENCOUNTER — Ambulatory Visit (HOSPITAL_COMMUNITY): Payer: No Payment, Other | Admitting: Licensed Clinical Social Worker

## 2022-09-19 NOTE — Telephone Encounter (Signed)
Pt sent two links with no response. LCSW f/u with PC and left HIPAA compliant message. LCSW waited until 0115 before disconnecting

## 2022-09-20 ENCOUNTER — Telehealth (HOSPITAL_COMMUNITY): Payer: No Payment, Other | Admitting: Student in an Organized Health Care Education/Training Program

## 2022-10-19 ENCOUNTER — Ambulatory Visit (HOSPITAL_COMMUNITY): Payer: Self-pay | Admitting: Licensed Clinical Social Worker

## 2022-11-10 NOTE — Congregational Nurse Program (Signed)
  Dept: [phone]   Congregational Nurse Program Note  Date of Encounter: 11/10/2022  Clinic visit for swollen calf area left leg.  Area is not warm to touch or red, slightly larger than right calf area.  Requested assistance with reestablishing care with [facility], has not ben taking prescribed medications. Left message to obtain appointment with A. Goldhammer, LCSW who had been treating him. 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 Yes    Student Assistance N/A    Location Patient Served  [facility]    Visit Setting with Client Organization    Patient Status Unhoused    Insurance Uninsured ([employer]/Care Connects/Self-Pay/Medicaid Family Planning)    Insurance/Financial Assistance Referral Medicaid    Medication Have Medication Insecurities    Medical Provider No    Screening Referrals Made N/A    Medical Referrals Made [facility]    Medical Appointment Made [facility]    Recently w/o PCP, now 1st time PCP visit completed due to CNs referral or appointment made N/A    Food Have Food Insecurities    Transportation N/A    Housing/Utilities No permanent housing    Interpersonal Safety Do not feel safe at current residence    Interventions Advocate/Support;Navigate Healthcare System;Counsel;Educate;Reviewed Medications    Screenings CN Performed N/A    Sent Client to Lab for: N/A    Did client attend any of the following based off CNs referral or appointments made? N/A    ED Visit Averted N/A    Life-Saving Intervention Made N/A

## 2022-11-17 NOTE — Congregational Nurse Program (Signed)
  Dept: [phone]   Congregational Nurse Program Note  Date of Encounter: 11/17/2022  Clinic visit to confirm appointment at [facility].  Due to change of his phone number he missed call from the therapist.  [facility] given correct mobile number to update their records.  Given walk-in appointment for Monday, January 8th at 7:20 AM.  Provided bus passes for the appointment. 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 Yes    Student Assistance N/A    Location Patient [facility]    Visit Setting with Client Organization    Patient Status Unhoused    Insurance Uninsured ([place] Card/Care Connects/Self-Pay/Medicaid Family Planning)    Insurance/Financial Assistance Referral Medicaid    Medication Have Medication Insecurities    Medical Provider No    Screening Referrals Made N/A    Medical Referrals [facility]    Recently w/o PCP, now 1st time PCP visit completed due to CNs referral or appointment made N/A    Food Have Food Insecurities    Transportation N/A    Housing/Utilities No permanent housing    Interpersonal Safety Do not feel safe at current residence    Interventions Advocate/Support;Navigate Healthcare System;Counsel;Educate    Abnormal to Normal Screening Since Last CN Visit N/A    Screenings CN Performed N/A    Sent Client to Lab for: N/A    Did client attend any of the following based off CNs referral or appointments made? N/A    ED Visit Averted N/A    Life-Saving Intervention Made N/A

## 2022-11-23 ENCOUNTER — Ambulatory Visit (HOSPITAL_COMMUNITY)
Admission: EM | Admit: 2022-11-23 | Discharge: 2022-11-23 | Disposition: A | Discharge status: Home / Self Care | Payer: Medicaid Other | Attending: Emergency Medicine | Admitting: Emergency Medicine

## 2022-11-23 ENCOUNTER — Encounter: Payer: Self-pay | Admitting: Physician Assistant

## 2022-11-23 ENCOUNTER — Encounter (HOSPITAL_COMMUNITY): Payer: Self-pay

## 2022-11-23 ENCOUNTER — Other Ambulatory Visit: Payer: Self-pay | Admitting: Critical Care Medicine

## 2022-11-23 ENCOUNTER — Encounter: Payer: Self-pay | Admitting: Critical Care Medicine

## 2022-11-23 DIAGNOSIS — R1013 Epigastric pain: Secondary | ICD-10-CM

## 2022-11-23 MED ORDER — ALUM & MAG HYDROXIDE-SIMETH 200-200-20 MG/5ML PO SUSP
ORAL | Status: AC
  Filled 2022-11-23: qty 30

## 2022-11-23 MED ORDER — LOPERAMIDE HCL 2 MG PO CAPS: 2.0000 mg | ORAL_CAPSULE | Freq: Four times a day (QID) | ORAL | 0 refills | Status: DC | PRN

## 2022-11-23 MED ORDER — ONDANSETRON 4 MG PO TBDP: 4.0000 mg | ORAL_TABLET | Freq: Three times a day (TID) | ORAL | 0 refills | Status: DC | PRN

## 2022-11-23 MED ORDER — ALUM & MAG HYDROXIDE-SIMETH 200-200-20 MG/5ML PO SUSP
15.0000 mL | Freq: Once | ORAL | Status: AC
  Administered 2022-11-23: 15 mL via ORAL

## 2022-11-23 NOTE — Progress Notes (Signed)
Pt seen by Dr Joya Gaskins.  Pt was at his mom's house, then got evicted and came here about 2 weeks ago.   Phone: [phone], address changed to here, mom's address listed as a permanent comment.   Pt w/ N&V, 2 episodes of diarrhea today, has not been sick previously. Does not usually have GI issues.   Has had some chills, some central chest pain helped by Maalox. Also some calf pain.   Has had back pain for years.   Went to UC, got rx for Zofran and imodium. Sent to [facility] on [address].  Denies drug use and ETOH, gets burning chest pain with spicy foods.   Does not usually get GI issues except for food-related sx. No probs w/ const/diarrhea.   Has poor dentition, no dentist since 23, also the last checkup.   Was given Lido patches x 6, Pepto, Tylenol, loperamide, omeprazole w/ antacid x 28.   Written instructions on meds given to pt.   Physical exam to be added by MD.  Rosaria Ferries, PA-C 11/23/2022 2:24 PM

## 2022-11-23 NOTE — Discharge Instructions (Signed)
Based on your exam there is tenderness over the stomach and intestinal area, and there is tenderness in your back however I do not believe anything more serious is going on with your organs and I do not believe there are any signs of infection or blood clot to your leg and therefore we will help to manage her symptoms  You have been given Maalox here today in the office to help try to settle your stomach discomfort  If you continue to have diarrhea you may pick up Imodium from the pharmacy and use every 6 hours  If you begin to experience vomiting or nausea you may pick up Zofran from the pharmacy and begin use  Increase your fluid intake and eat food as tolerated to maintain your hydration  You may use heat over the calf muscle in 10 to 15-minute intervals you may use Tylenol or ibuprofen as needed for pain  You may follow-up with urgent care for reevaluation as needed
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
## 2022-11-23 NOTE — ED Triage Notes (Signed)
Pt is here for right leg pain, abdominal pain, chills, back pain,  and weakness since today

## 2022-11-25 ENCOUNTER — Telehealth: Payer: Self-pay | Admitting: Emergency Medicine

## 2022-11-25 DIAGNOSIS — K089 Disorder of teeth and supporting structures, unspecified: Secondary | ICD-10-CM

## 2022-11-25 NOTE — Telephone Encounter (Signed)
Copied from [place] [phone]. Topic: Referral - Request for Referral >> Nov 24, 2022  3:24 PM Erskine Squibb wrote: Has patient seen PCP for this complaint? Yes.   Referral for which specialty: Dentist Preferred provider/office: Any dentist in [place] that takes Medicaid Reason for referral: Several broken teeth  Please assist patient further as Dr Joya Gaskins saw him at the [facility].

## 2022-11-29 NOTE — Telephone Encounter (Signed)
Referral has been placed. 

## 2022-11-29 NOTE — Addendum Note (Signed)
Addended by: Charlott Rakes on: 11/29/2022 08:16 AM   Modules accepted: Orders

## 2022-11-29 NOTE — Telephone Encounter (Signed)
Called patient and he is aware of referral

## 2022-12-05 ENCOUNTER — Other Ambulatory Visit (HOSPITAL_COMMUNITY): Payer: Self-pay

## 2022-12-05 ENCOUNTER — Encounter (HOSPITAL_COMMUNITY): Payer: Self-pay | Admitting: Psychiatry

## 2022-12-05 ENCOUNTER — Ambulatory Visit (INDEPENDENT_AMBULATORY_CARE_PROVIDER_SITE_OTHER): Payer: No Payment, Other | Admitting: Psychiatry

## 2022-12-05 VITALS — BP 124/80 | HR 76

## 2022-12-05 DIAGNOSIS — F17200 Nicotine dependence, unspecified, uncomplicated: Secondary | ICD-10-CM | POA: Diagnosis not present

## 2022-12-05 DIAGNOSIS — F901 Attention-deficit hyperactivity disorder, predominantly hyperactive type: Secondary | ICD-10-CM | POA: Diagnosis not present

## 2022-12-05 DIAGNOSIS — F3181 Bipolar II disorder: Secondary | ICD-10-CM

## 2022-12-05 MED ORDER — ATOMOXETINE HCL 40 MG PO CAPS
40.0000 mg | ORAL_CAPSULE | Freq: Every day | ORAL | 1 refills | Status: DC
  Filled 2023-01-11: qty 30, 30d supply, fill #0

## 2022-12-05 MED ORDER — ATOMOXETINE HCL 40 MG PO CAPS
40.0000 mg | ORAL_CAPSULE | Freq: Every day | ORAL | 3 refills | Status: DC
Start: 2022-12-05 — End: 2022-12-05
  Filled 2022-12-05: qty 30, 30d supply, fill #0

## 2022-12-05 NOTE — Progress Notes (Signed)
BH MD/PA/NP OP Progress Note  12/05/2022 9:00 AM Cameron Cruz  MRN:  [id number]  Chief Complaint:  Chief Complaint  Patient presents with   Follow-up   Medication Refill   HPI: Patient is a 32 year old male with past psychiatric history of bipolar 2 disorder, GAD, ADHD, tobacco dependence presented to [facility] outpatient clinic as a walk-in for medication refill.  He last saw Maggie Font virtually for initial evaluation on 06/24/22.  At that time, he was prescribed Abilify for 5 mg daily which was increased later to 10 g daily, Strattera 40 mg daily, hydroxyzine 10 mg 3 times daily as needed for anxiety add nicotine patch 21 mg..  Patient states that he needs medication refill for Strattera.  He has not been taking Abilify and hydroxyzine but has refills for  these medications.  He last took Abilify 2 months ago.  Discussed the importance of medication compliance for mood stabilization.  He reports that he is planning to start taking his medications regularly.  He reports that these medication helps him with his mood and attention. He denies use of any illicit drugs or alcohol.  He smokes cigarette 1 pack a day.  He reports that he does not want to use nicotine patch anymore.  He reports that he is living in a shelter which is his biggest stressors.  He does not have any social support.  He gets mad, angry and gets into arguments sometimes.  He reports high energy episodes when he take risks (drives fast), feels happy, and spends money excessively. He denies pressured speech and decreased need for sleep.  He reports that these episodes lasts for couple of hours to max 5 days.  He had been on Abilify, trazodone and Adderall in the past.  He denies any side effects from medications.  He wants to continue Strattera and Abilify. Currently, he denies any SI, HI, AVH.  He denies any paranoia.  He is sleeping well and reports stable appetite.  He denies any weight gain or weight loss.  He denies any  previous suicidal attempts or psychiatric hospitalizations.  He reports that he was diagnosed with ADHD 10 years ago and was recently taking Adderall.  Visit Diagnosis:    ICD-10-CM   1. Bipolar 2 disorder, major depressive episode ([place])  F31.81     2. Tobacco dependence  F17.200     3. Attention deficit hyperactivity disorder (ADHD), predominantly hyperactive type  F90.1 atomoxetine (STRATTERA) 40 MG capsule    DISCONTINUED: atomoxetine (STRATTERA) 40 MG capsule      Past Psychiatric History: ADHD, Depression, Anxiety   Past Medical History:  Past Medical History:  Diagnosis Date   Anxiety    Attention deficit disorder as a child   Bipolar disorder ([place])    Depression    No past surgical history on file.  Family Psychiatric History: Unknown  Family History:  Family History  Family history unknown: Yes    Social History:  Social History   Socioeconomic History   Marital status: Single    Spouse name: Not on file   Number of children: Not on file   Years of education: Not on file   Highest education level: Not on file  Occupational History   Not on file  Tobacco Use   Smoking status: Every Day    Packs/day: 1.00    Years: 5.00    Total pack years: 5.00    Types: Cigarettes   Smokeless tobacco: Never  Vaping Use  Vaping Use: Never used  Substance and Sexual Activity   Alcohol use: Not Currently    Comment: occasionally   Drug use: Never   Sexual activity: Not Currently  Other Topics Concern   Not on file  Social History Narrative   Not on file   Social Determinants of Health   Financial Resource Strain: High Risk (06/20/2022)   Overall Financial Resource Strain (CARDIA)    Difficulty of Paying Living Expenses: Very hard  Food Insecurity: Food Insecurity Present (06/20/2022)   Hunger Vital Sign    Worried About Running Out of Food in the Last Year: Often true    Ran Out of Food in the Last Year: Often true  Transportation Needs: Unmet Transportation  Needs (06/20/2022)   PRAPARE - Administrator, Civil Service (Medical): Yes    Lack of Transportation (Non-Medical): Yes  Physical Activity: Inactive (06/20/2022)   Exercise Vital Sign    Days of Exercise per Week: 0 days    Minutes of Exercise per Session: 0 min  Stress: Stress Concern Present (06/20/2022)   [employer] of Occupational Health - Occupational Stress Questionnaire    Feeling of Stress : Rather much  Social Connections: Socially Isolated (06/20/2022)   Social Connection and Isolation Panel [NHANES]    Frequency of Communication with Friends and Family: Twice a week    Frequency of Social Gatherings with Friends and Family: Twice a week    Attends Religious Services: Never    Database administrator or Organizations: No    Attends Engineer, structural: Never    Marital Status: Never married    Allergies: No Known Allergies  Metabolic Disorder Labs: Lab Results  Component Value Date   HGBA1C 5.4 09/08/2021   MPG 108.28 09/08/2021   No results found for: "PROLACTIN" Lab Results  Component Value Date   CHOL 151 09/08/2021   TRIG 57 09/08/2021   HDL 36 (L) 09/08/2021   CHOLHDL 4.2 09/08/2021   VLDL 11 09/08/2021   LDLCALC 104 (H) 09/08/2021   Lab Results  Component Value Date   TSH 1.198 09/08/2021   TSH 1.585 11/10/2008    Therapeutic Level Labs: No results found for: "LITHIUM" No results found for: "VALPROATE" No results found for: "CBMZ"  Current Medications: Current Outpatient Medications  Medication Sig Dispense Refill   ARIPiprazole (ABILIFY) 10 MG tablet Take 1 tablet (10 mg total) by mouth daily. 30 tablet 3   atomoxetine (STRATTERA) 40 MG capsule Take 1 capsule (40 mg total) by mouth daily. 30 capsule 1   hydrOXYzine (ATARAX) 10 MG tablet Take 1 tablet (10 mg total) by mouth 3 (three) times daily as needed. 90 tablet 3   loperamide (IMODIUM) 2 MG capsule Take 1 capsule (2 mg total) by mouth 4 (four) times daily as needed for  diarrhea or loose stools. 12 capsule 0   nicotine (NICODERM CQ) 21 mg/24hr patch Place 1 patch (21 mg total) onto the skin daily. 28 patch 3   ondansetron (ZOFRAN-ODT) 4 MG disintegrating tablet Take 1 tablet (4 mg total) by mouth every 8 (eight) hours as needed for nausea or vomiting. 20 tablet 0   No current facility-administered medications for this visit.     Musculoskeletal: Strength & Muscle Tone: within normal limits Gait & Station: normal Patient leans: N/A  Psychiatric Specialty Exam: Review of Systems  Blood pressure 124/80, pulse 76, SpO2 97 %.There is no height or weight on file to calculate BMI.  General Appearance:  Casual  Eye Contact:  Good  Speech:  Clear and Coherent and Normal Rate  Volume:  Normal  Mood:  Anxious  Affect:  Constricted  Thought Process:  Coherent  Orientation:  Full (Time, Place, and Person)  Thought Content: Logical   Suicidal Thoughts:  No  Homicidal Thoughts:  No  Memory:  Immediate;   Good Recent;   Fair  Judgement:  Poor  Insight:  Fair  Psychomotor Activity:  Normal  Concentration:  Concentration: Good and Attention Span: Good  Recall:  Good  Fund of Knowledge: Good  Language: Good  Akathisia:  No  Handed:  Right  AIMS (if indicated): not done  Assets:  Communication Skills Desire for Improvement Physical Health  ADL's:  Intact  Cognition: WNL  Sleep:  Good   Screenings: GAD-7    Health and safety inspector from 08/02/2022 in [facility] Counselor from 07/13/2022 in [facility] Office Visit from 06/24/2022 in [facility] Counselor from 06/20/2022 in [facility]  Total GAD-7 Score 13 12 15 15       [phone]    Flowsheet Row Counselor from 08/02/2022 in [facility] Counselor from 07/13/2022 in [facility] Office Visit from 06/24/2022 in [facility] Counselor from 06/20/2022 in [place]  PHQ-2 Total Score 3 3 5 4   PHQ-9 Total Score 12 11 20 13       [place] [facility] from 11/23/2022 in [facility] [place] from 06/24/2022 in [facility] Counselor from 06/20/2022 in [place] No Risk No Risk No Risk        Assessment and Plan: Patient is a 32 year old male with past psychiatric history of bipolar 2 disorder, GAD, ADHD, tobacco dependence presented to [facility] outpatient clinic as a walk-in for medication refill.  He last saw Maggie Font virtually for initial evaluation on 06/24/22.  At that time, he was prescribed Abilify for 5 mg daily which was increased later to 10 g daily, Strattera 40 mg daily, hydroxyzine 10 mg 3 times daily as needed for anxiety add nicotine patch 21 mg.  Patient has been noncompliant with his medications for last couple of months but now wants to restart his medications.  He has refills for Abilify but does not have refills for Strattera.  He is requesting refill for Strattera. Encouraged medication compliance.  He is not planning to restart hydroxyzine as he does not need it.   1. Bipolar 2 disorder, major depressive episode (HCC)   Restart ARIPiprazole (ABILIFY) 10 MG tablet; Take 1 tablet (10 mg total) by mouth daily.  Patient has refills.  2. Generalized anxiety disorder   Stop hydroxyzine. Hasn't been taking hydrOXYzine (ATARAX); for anxiety.  He is not planning to restart hydroxyzine.   3. Attention deficit hyperactivity disorder (ADHD), predominantly hyperactive type   Continue atomoxetine (STRATTERA) 40 MG capsule; Take 1 capsule (40 mg total) by mouth daily.  Dispense: 30 capsule; Refill: 1   Follow-up in 2 months  Collaboration of Care: Collaboration of Care: Other none  Patient/Guardian was advised Release of Information must be obtained prior to  any record release in order to collaborate their care with an outside provider. Patient/Guardian was advised if they have not already done so to contact the registration department to sign all necessary forms in order for  [place] to release information regarding their care.   Consent: Patient/Guardian gives verbal consent for treatment and assignment of benefits for services provided during this visit. Patient/Guardian expressed understanding and agreed to proceed.    Armando Reichert, MD 12/05/2022, 9:00 AM

## 2022-12-05 NOTE — Progress Notes (Deleted)
Psychiatric Initial Adult Assessment   Patient Identification: Cameron Cruz MRN:  [id number] Date of Evaluation:  12/05/2022 Referral Source: *** Chief Complaint:  No chief complaint on file.  Visit Diagnosis: No diagnosis found.  History of Present Illness:  ***  Associated Signs/Symptoms: Depression Symptoms:  {DEPRESSION SYMPTOMS:20000} (Hypo) Manic Symptoms:  {BHH MANIC SYMPTOMS:22872} Anxiety Symptoms:  {BHH ANXIETY SYMPTOMS:22873} Psychotic Symptoms:  {BHH PSYCHOTIC SYMPTOMS:22874} PTSD Symptoms: {BHH PTSD SYMPTOMS:22875}  Past Psychiatric History: ***  Previous Psychotropic Medications: {YES/NO:21197}  Substance Abuse History in the last 12 months:  {yes no:314532}  Consequences of Substance Abuse: {BHH CONSEQUENCES OF SUBSTANCE ABUSE:22880}  Past Medical History:  Past Medical History:  Diagnosis Date   Anxiety    Attention deficit disorder as a child   Bipolar disorder (HCC)    Depression    No past surgical history on file.  Family Psychiatric History: ***  Family History:  Family History  Family history unknown: Yes    Social History:   Social History   Socioeconomic History   Marital status: Single    Spouse name: Not on file   Number of children: Not on file   Years of education: Not on file   Highest education level: Not on file  Occupational History   Not on file  Tobacco Use   Smoking status: Every Day    Packs/day: 1.00    Years: 5.00    Total pack years: 5.00    Types: Cigarettes   Smokeless tobacco: Never  Vaping Use   Vaping Use: Never used  Substance and Sexual Activity   Alcohol use: Not Currently    Comment: occasionally   Drug use: Never   Sexual activity: Not Currently  Other Topics Concern   Not on file  Social History Narrative   Not on file   Social Determinants of Health   Financial Resource Strain: High Risk (06/20/2022)   Overall Financial Resource Strain (CARDIA)    Difficulty of Paying Living Expenses:  Very hard  Food Insecurity: Food Insecurity Present (06/20/2022)   Hunger Vital Sign    Worried About Running Out of Food in the Last Year: Often true    Ran Out of Food in the Last Year: Often true  Transportation Needs: Unmet Transportation Needs (06/20/2022)   PRAPARE - Administrator, Civil Service (Medical): Yes    Lack of Transportation (Non-Medical): Yes  Physical Activity: Inactive (06/20/2022)   Exercise Vital Sign    Days of Exercise per Week: 0 days    Minutes of Exercise per Session: 0 min  Stress: Stress Concern Present (06/20/2022)   [employer] of Occupational Health - Occupational Stress Questionnaire    Feeling of Stress : Rather much  Social Connections: Socially Isolated (06/20/2022)   Social Connection and Isolation Panel [NHANES]    Frequency of Communication with Friends and Family: Twice a week    Frequency of Social Gatherings with Friends and Family: Twice a week    Attends Religious Services: Never    Database administrator or Organizations: No    Attends Engineer, structural: Never    Marital Status: Never married    Additional Social History: ***  Allergies:  No Known Allergies  Metabolic Disorder Labs: Lab Results  Component Value Date   HGBA1C 5.4 09/08/2021   MPG 108.28 09/08/2021   No results found for: "PROLACTIN" Lab Results  Component Value Date   CHOL 151 09/08/2021   TRIG 57 09/08/2021  HDL 36 (L) 09/08/2021   CHOLHDL 4.2 09/08/2021   VLDL 11 09/08/2021   LDLCALC 104 (H) 09/08/2021   Lab Results  Component Value Date   TSH 1.198 09/08/2021    Therapeutic Level Labs: No results found for: "LITHIUM" No results found for: "CBMZ" No results found for: "VALPROATE"  Current Medications: Current Outpatient Medications  Medication Sig Dispense Refill   ARIPiprazole (ABILIFY) 10 MG tablet Take 1 tablet (10 mg total) by mouth daily. 30 tablet 3   atomoxetine (STRATTERA) 40 MG capsule Take 1 capsule (40 mg total)  by mouth daily. 30 capsule 3   hydrOXYzine (ATARAX) 10 MG tablet Take 1 tablet (10 mg total) by mouth 3 (three) times daily as needed. 90 tablet 3   loperamide (IMODIUM) 2 MG capsule Take 1 capsule (2 mg total) by mouth 4 (four) times daily as needed for diarrhea or loose stools. 12 capsule 0   nicotine (NICODERM CQ) 21 mg/24hr patch Place 1 patch (21 mg total) onto the skin daily. 28 patch 3   ondansetron (ZOFRAN-ODT) 4 MG disintegrating tablet Take 1 tablet (4 mg total) by mouth every 8 (eight) hours as needed for nausea or vomiting. 20 tablet 0   No current facility-administered medications for this visit.    Musculoskeletal: Strength & Muscle Tone: {desc; muscle tone:32375} Gait & Station: {PE GAIT ED OIZT:24580} Patient leans: {Patient Leans:21022755}  Psychiatric Specialty Exam: Review of Systems  There were no vitals taken for this visit.There is no height or weight on file to calculate BMI.  General Appearance: {Appearance:22683}  Eye Contact:  {BHH EYE CONTACT:22684}  Speech:  {Speech:22685}  Volume:  {Volume (PAA):22686}  Mood:  {BHH MOOD:22306}  Affect:  {Affect (PAA):22687}  Thought Process:  {Thought Process (PAA):22688}  Orientation:  {BHH ORIENTATION (PAA):22689}  Thought Content:  {Thought Content:22690}  Suicidal Thoughts:  {ST/HT (PAA):22692}  Homicidal Thoughts:  {ST/HT (PAA):22692}  Memory:  {BHH MEMORY:22881}  Judgement:  {Judgement (PAA):22694}  Insight:  {Insight (PAA):22695}  Psychomotor Activity:  {Psychomotor (PAA):22696}  Concentration:  {Concentration:21399}  Recall:  {BHH GOOD/FAIR/POOR:22877}  Fund of Knowledge:{BHH GOOD/FAIR/POOR:22877}  Language: {BHH GOOD/FAIR/POOR:22877}  Akathisia:  {BHH YES OR NO:22294}  Handed:  {Handed:22697}  AIMS (if indicated):  {Desc; done/not:10129}  Assets:  {Assets (PAA):22698}  ADL's:  {BHH DXI'P:38250}  Cognition: {chl bhh cognition:304700322}  Sleep:  {BHH GOOD/FAIR/POOR:22877}   Screenings: GAD-7     Health and safety inspector from 08/02/2022 in [facility] Counselor from 07/13/2022 in [facility] Office Visit from 06/24/2022 in [facility] Counselor from 06/20/2022 in [facility]  Total GAD-7 Score 13 12 15 15       PHQ2-9    Flowsheet Row Counselor from 08/02/2022 in [facility] Counselor from 07/13/2022 in [facility] Office Visit from 06/24/2022 in [facility] Counselor from 06/20/2022 in [place]  PHQ-2 Total Score 3 3 5 4   PHQ-9 Total Score 12 11 20 13       Flowsheet Row ED from 11/23/2022 in [facility] [place] from 06/24/2022 in [facility] Counselor from 06/20/2022 in [place] No Risk No Risk No Risk       Assessment and Plan: ***  Collaboration of Care: {BH OP Collaboration of Care:21014065}  Patient/Guardian was advised Release of Information must be obtained prior to any record  release in order to collaborate their care with an outside provider. Patient/Guardian was advised if they have not already done so to contact the registration department to sign all necessary forms in order for [place] to release information regarding their care.   Consent: Patient/Guardian gives verbal consent for treatment and assignment of benefits for services provided during this visit. Patient/Guardian expressed understanding and agreed to proceed.   Armando Reichert, MD 1/22/20248:16 AM

## 2022-12-07 ENCOUNTER — Other Ambulatory Visit: Payer: Self-pay

## 2022-12-07 ENCOUNTER — Other Ambulatory Visit: Payer: Self-pay | Admitting: Critical Care Medicine

## 2022-12-07 ENCOUNTER — Encounter: Payer: Self-pay | Admitting: Critical Care Medicine

## 2022-12-07 MED ORDER — OSELTAMIVIR PHOSPHATE 75 MG PO CAPS
75.0000 mg | ORAL_CAPSULE | Freq: Two times a day (BID) | ORAL | 0 refills | Status: AC
  Filled 2022-12-07: qty 10, 5d supply, fill #0

## 2022-12-08 ENCOUNTER — Other Ambulatory Visit (HOSPITAL_COMMUNITY): Payer: Self-pay

## 2022-12-08 NOTE — Progress Notes (Signed)
This is a 32 year old male who is seen in return follow-up at the [place] clinic.  He has had 2 days of fever headaches loss of taste and smell dry cough no nasal drainage some back pain.  Temperature 100 degrees at this visit.  On exam there is mild pharyngeal injection without evidence of purulence nares are clear chest is clear cardiac unremarkable remainder exam unremarkable    COVID test is negative  Impression is that of probable acute influenza  Plan for the patient received Tamiflu 75 mg twice daily for 5 days   he has return follow-up with me in [facility] February 27 at 9:30 AM sal drainage some back pain.  Temperature is

## 2023-01-08 NOTE — Progress Notes (Deleted)
New Patient Office Visit  Subjective    Patient ID: Cameron Cruz, male    DOB: 1991-03-02  Age: 32 y.o. MRN: [id number]  CC: No chief complaint on file.   HPI Cameron Cruz presents to establish care This is a 32 year old male who is seen in return follow-up at the [place] clinic.  He has had 2 days of fever headaches loss of taste and smell dry cough no nasal drainage some back pain.  Temperature 100 degrees at this visit.   On exam there is mild pharyngeal injection without evidence of purulence nares are clear chest is clear cardiac unremarkable remainder exam unremarkable     COVID test is negative   Impression is that of probable acute influenza   Plan for the patient received Tamiflu 75 mg twice daily for 5 days    he has return follow-up with me in our [facility] February 27 at 9:30 AM sal drainage some back pain.  Per [place] 12/05/22  Patient is a 32 year old male with past psychiatric history of bipolar 2 disorder, GAD, ADHD, tobacco dependence presented to [facility] outpatient clinic as a walk-in for medication refill.  He last saw Maggie Font virtually for initial evaluation on 06/24/22.  At that time, he was prescribed Abilify for 5 mg daily which was increased later to 10 g daily, Strattera 40 mg daily, hydroxyzine 10 mg 3 times daily as needed for anxiety add nicotine patch 21 mg.   Patient has been noncompliant with his medications for last couple of months but now wants to restart his medications.  He has refills for Abilify but does not have refills for Strattera.  He is requesting refill for Strattera. Encouraged medication compliance.  He is not planning to restart hydroxyzine as he does not need it.    1. Bipolar 2 disorder, major depressive episode (HCC)   Restart ARIPiprazole (ABILIFY) 10 MG tablet; Take 1 tablet (10 mg total) by mouth daily.  Patient has refills.   2. Generalized anxiety disorder   Stop hydroxyzine.  Hasn't been taking hydrOXYzine (ATARAX); for anxiety.  He is not planning to restart hydroxyzine.   3. Attention deficit hyperactivity disorder (ADHD), predominantly hyperactive type   Continue atomoxetine (STRATTERA) 40 MG capsule; Take 1 capsule (40 mg total) by mouth daily.  Dispense: 30 capsule; Refill: 1   Follow-up in 2 months Outpatient Encounter Medications as of 01/10/2023  Medication Sig   ARIPiprazole (ABILIFY) 10 MG tablet Take 1 tablet (10 mg total) by mouth daily.   atomoxetine (STRATTERA) 40 MG capsule Take 1 capsule (40 mg total) by mouth daily.   loperamide (IMODIUM) 2 MG capsule Take 1 capsule (2 mg total) by mouth 4 (four) times daily as needed for diarrhea or loose stools.   nicotine (NICODERM CQ) 21 mg/24hr patch Place 1 patch (21 mg total) onto the skin daily.   ondansetron (ZOFRAN-ODT) 4 MG disintegrating tablet Take 1 tablet (4 mg total) by mouth every 8 (eight) hours as needed for nausea or vomiting.   No facility-administered encounter medications on file as of 01/10/2023.    Past Medical History:  Diagnosis Date   Anxiety    Attention deficit disorder as a child   Bipolar disorder ([place])    Depression     No past surgical history on file.  Family History  Family history unknown: Yes    Social History   Socioeconomic History   Marital status: Single  Spouse name: Not on file   Number of children: Not on file   Years of education: Not on file   Highest education level: Not on file  Occupational History   Not on file  Tobacco Use   Smoking status: Every Day    Packs/day: 1.00    Years: 5.00    Total pack years: 5.00    Types: Cigarettes   Smokeless tobacco: Never  Vaping Use   Vaping Use: Never used  Substance and Sexual Activity   Alcohol use: Not Currently    Comment: occasionally   Drug use: Never   Sexual activity: Not Currently  Other Topics Concern   Not on file  Social History Narrative   Not on file   Social Determinants of  Health   Financial Resource Strain: High Risk (06/20/2022)   Overall Financial Resource Strain (CARDIA)    Difficulty of Paying Living Expenses: Very hard  Food Insecurity: Food Insecurity Present (06/20/2022)   Hunger Vital Sign    Worried About Running Out of Food in the Last Year: Often true    Ran Out of Food in the Last Year: Often true  Transportation Needs: Unmet Transportation Needs (06/20/2022)   PRAPARE - Hydrologist (Medical): Yes    Lack of Transportation (Non-Medical): Yes  Physical Activity: Inactive (06/20/2022)   Exercise Vital Sign    Days of Exercise per Week: 0 days    Minutes of Exercise per Session: 0 min  Stress: Stress Concern Present (06/20/2022)   [place]    Feeling of Stress : Rather much  Social Connections: Socially Isolated (06/20/2022)   Social Connection and Isolation Panel [NHANES]    Frequency of Communication with Friends and Family: Twice a week    Frequency of Social Gatherings with Friends and Family: Twice a week    Attends Religious Services: Never    Marine scientist or Organizations: No    Attends Archivist Meetings: Never    Marital Status: Never married  Intimate Partner Violence: Not At Risk (06/20/2022)   Humiliation, Afraid, Rape, and Kick questionnaire    Fear of Current or Ex-Partner: No    Emotionally Abused: No    Physically Abused: No    Sexually Abused: No    ROS      Objective    There were no vitals taken for this visit.  Physical Exam  {Labs (Optional):23779}    Assessment & Plan:   Problem List Items Addressed This Visit   None   No follow-ups on file.   Cameron Noble, MD

## 2023-01-10 ENCOUNTER — Ambulatory Visit: Payer: Medicaid Other | Attending: Critical Care Medicine | Admitting: Critical Care Medicine

## 2023-01-10 NOTE — Congregational Nurse Program (Signed)
  Dept: [phone]   Congregational Nurse Program Note  Date of Encounter: 01/10/2023  Clinic visit for complaint of ear pain and nasal congestion. Temperature 98.3, BP 111/75, pulse 90 and regular, O2 Sat 96%.  States he has had symptoms about a week, clear nasal drainage, no wheezing or coughing.  Declined to take a rapid COVID-19 test.  Referred to see MD at [facility] clinic on 2/28. 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 Yes    Student Assistance N/A    Location Patient [facility]    Visit Setting with Client Organization    Patient Status Unhoused    Insurance Uninsured ([employer]/Care Connects/Self-Pay/Medicaid Family Planning)    Insurance/Financial Assistance Referral N/A    Medication Have Medication Insecurities    Medical Provider No    Screening Referrals Made N/A    Medical Referrals Made Non-[facility] PCP/Clinic    Medical Appointment Made N/A    Recently w/o PCP, now 1st time PCP visit completed due to CNs referral or appointment made N/A    Food Have Food Insecurities    Transportation Need transportation assistance    Housing/Utilities No permanent housing    Interpersonal Safety Do not feel safe at current residence    Interventions Advocate/Support;Navigate Healthcare System;Counsel;Educate    Abnormal to Normal Screening Since Last CN Visit N/A    Screenings CN Performed Blood Pressure;Pulse Ox;Temperature    Sent Client to Lab for: N/A    Did client attend any of the following based off CNs referral or appointments made? N/A    ED Visit Averted N/A    Life-Saving Intervention Made N/A

## 2023-01-11 ENCOUNTER — Other Ambulatory Visit (HOSPITAL_COMMUNITY): Payer: Self-pay

## 2023-01-11 ENCOUNTER — Encounter: Payer: Self-pay | Admitting: Physician Assistant

## 2023-01-11 ENCOUNTER — Ambulatory Visit (HOSPITAL_COMMUNITY): Payer: Medicaid Other | Admitting: Licensed Clinical Social Worker

## 2023-01-11 ENCOUNTER — Other Ambulatory Visit: Payer: Self-pay | Admitting: Family Medicine

## 2023-01-11 MED ORDER — AMOXICILLIN-POT CLAVULANATE 875-125 MG PO TABS
1.0000 | ORAL_TABLET | Freq: Two times a day (BID) | ORAL | 0 refills | Status: AC
  Filled 2023-01-11: qty 14, 7d supply, fill #0

## 2023-01-11 NOTE — Progress Notes (Cosign Needed Addendum)
Pt seen by Dr Florene Glen.   For about 2 weeks he has been unable to hear out of his L ear. His R ear is starting to bother him as well.   He has a cough, but not productive, considers it a smokers cough.   R TM looks erythemic and injected.  L TM is bulging and opaque behind it.   Dx sinus infection.  Throat normal in appearance and lymph nodes ok.   114/78 88 93%  He was given 100 tabs of ibuprofen 200 mg.  Rx sent in for augmentin, sent to [facility] outpt pharmacy, pt request.   Rosaria Ferries, PA-C 01/11/2023 2:26 PM

## 2023-01-21 IMAGING — DX DG CHEST 2V
2 series · 2 of 2 positions shown · non-contrast
Comparison: 11/11/2008

CLINICAL DATA: Back pain.  Pleuritic pain.

EXAM:
CHEST - 2 VIEW

[chest pa]
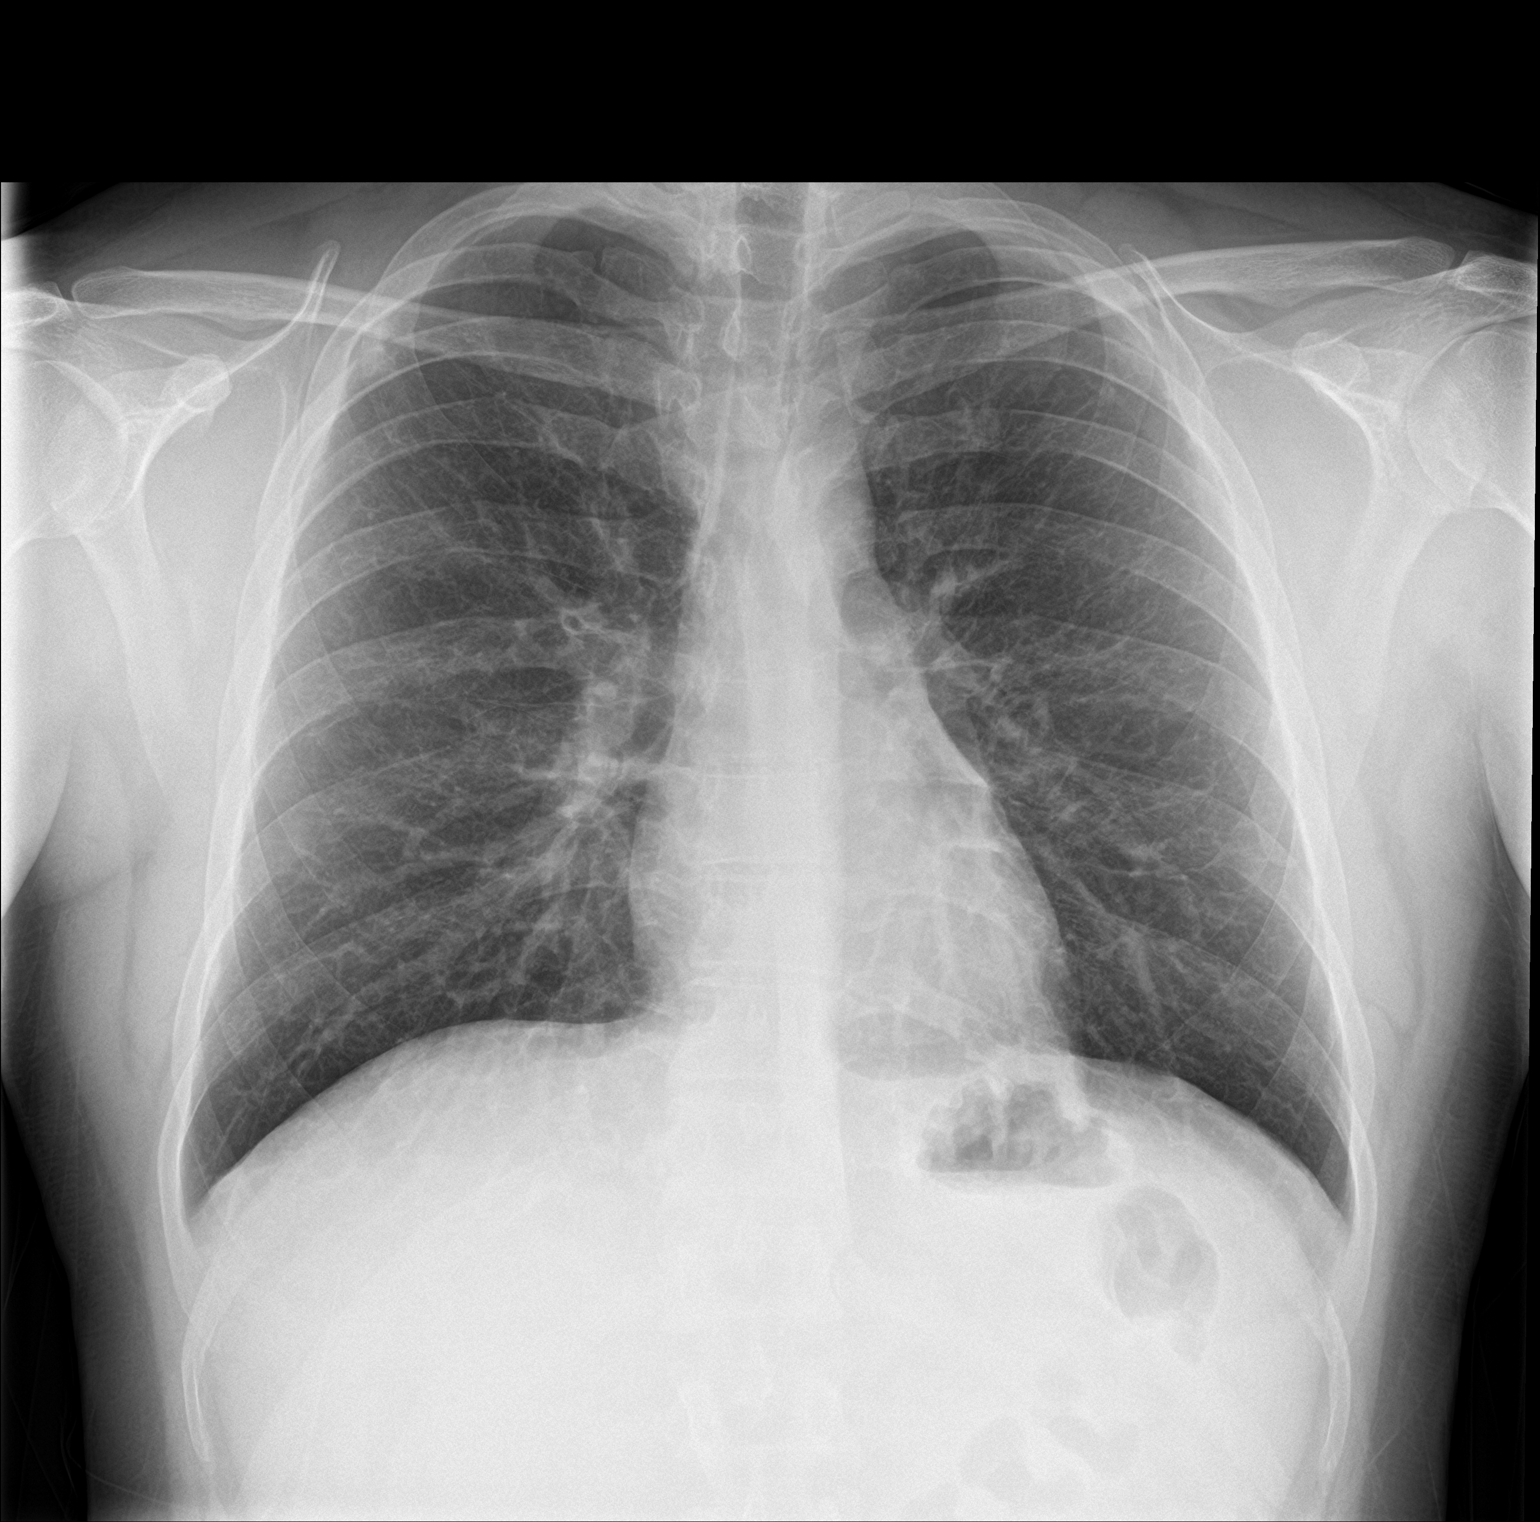

[chest lat]
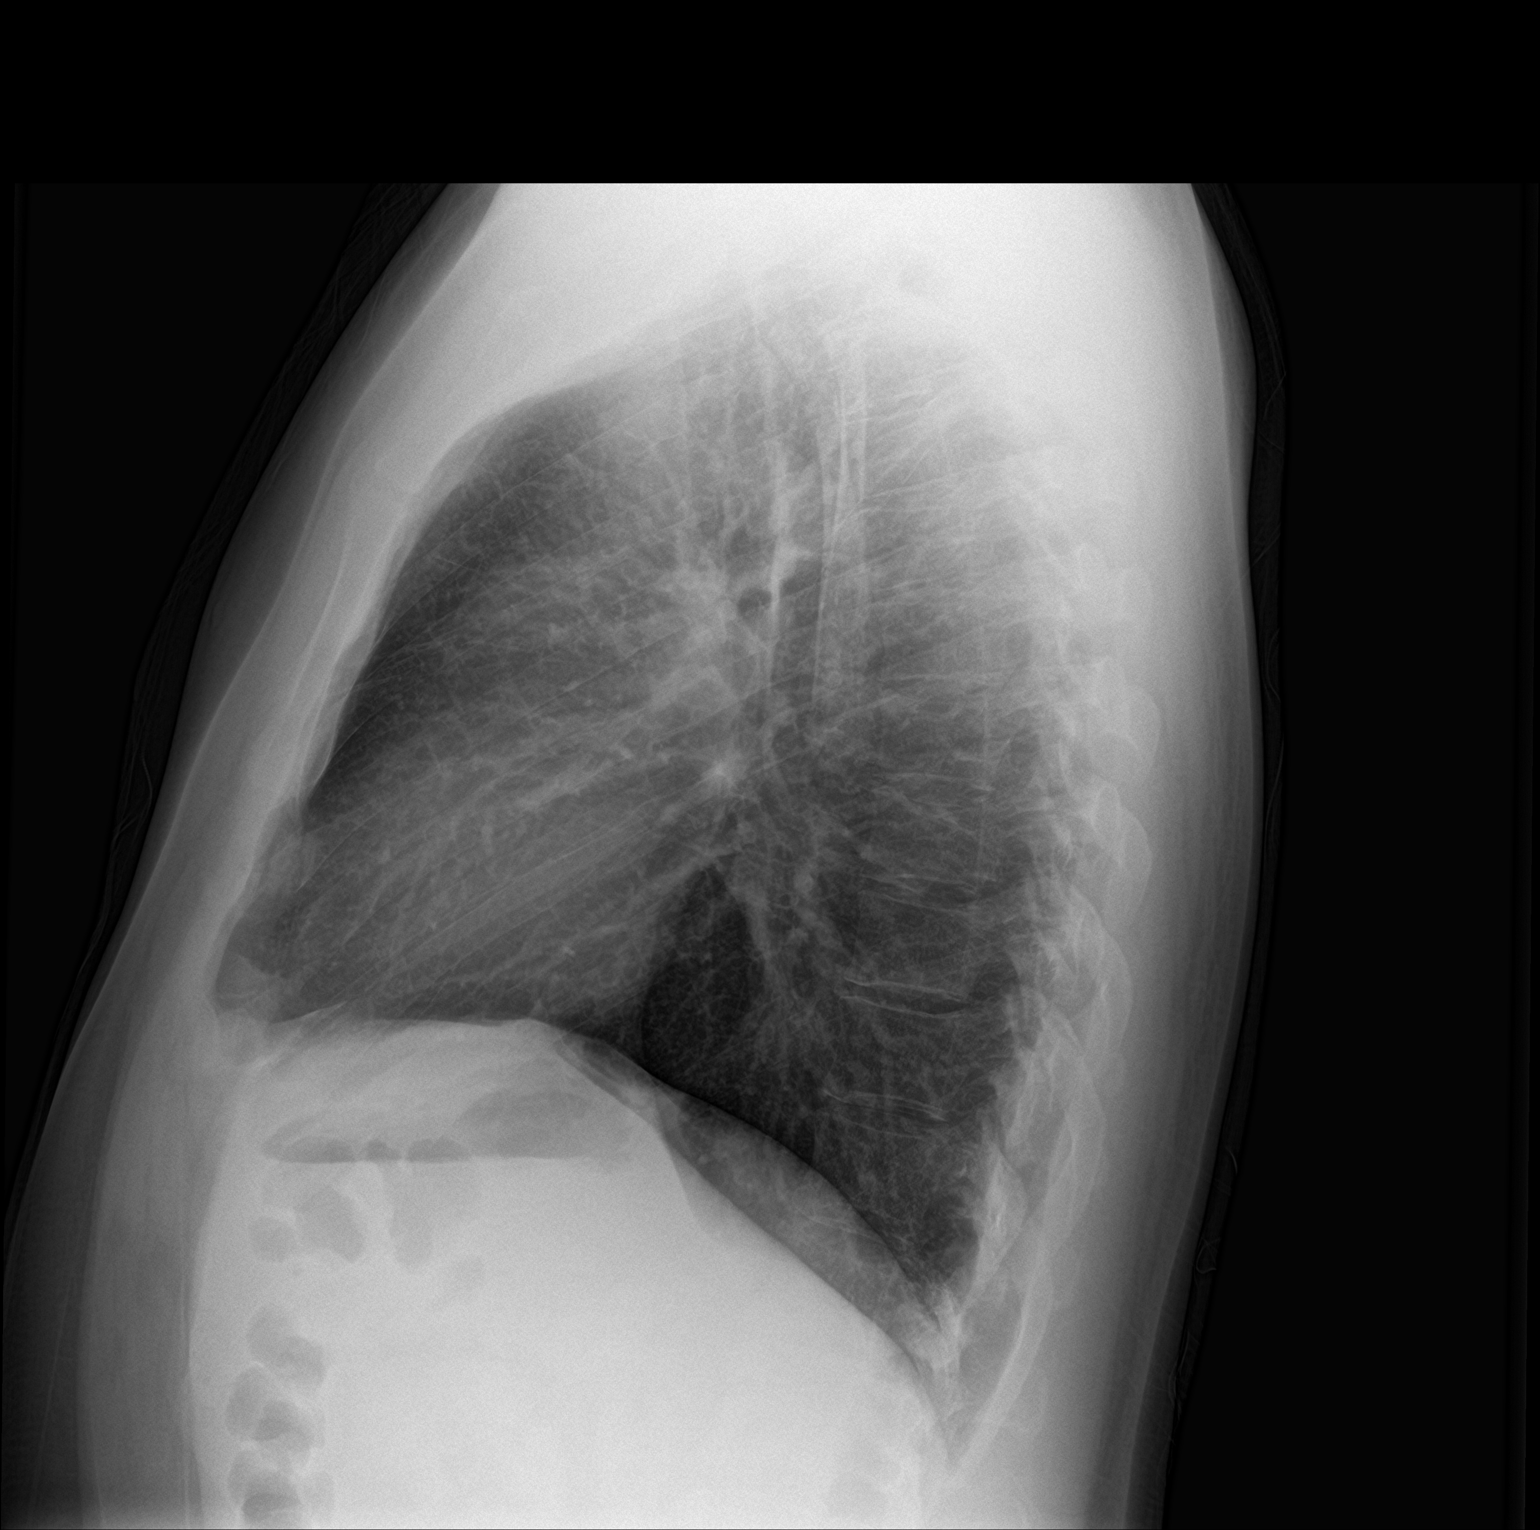

[2 of 2 positions shown; findings below may reference images not displayed]

FINDINGS: The lungs are clear without focal pneumonia, edema, pneumothorax or
pleural effusion. Irregular 14 mm nodule identified peripheral right
upper lobe, superimposed on the anterior right second rib. The
cardiopericardial silhouette is within normal limits for size. The
visualized bony structures of the thorax show no acute abnormality.
IMPRESSION: Irregular 14 mm nodule peripheral right upper lobe. CT chest without
contrast recommended to further evaluate.

## 2023-01-29 NOTE — Progress Notes (Unsigned)
New Patient Office Visit  Subjective    Patient ID: Cameron Cruz, male    DOB: [birth year]  Age: 32 y.o. MRN: [id number]  CC: No chief complaint on file.   HPI STEVIN Cruz presents to establish care 32 y.o.M with  ADHD, Bipolar , Depression. Homeless at [place] flu tdap smoker Outpatient Encounter Medications as of 01/31/2023  Medication Sig   ARIPiprazole (ABILIFY) 10 MG tablet Take 1 tablet (10 mg total) by mouth daily.   atomoxetine (STRATTERA) 40 MG capsule Take 1 capsule (40 mg total) by mouth daily.   loperamide (IMODIUM) 2 MG capsule Take 1 capsule (2 mg total) by mouth 4 (four) times daily as needed for diarrhea or loose stools.   nicotine (NICODERM CQ) 21 mg/24hr patch Place 1 patch (21 mg total) onto the skin daily.   ondansetron (ZOFRAN-ODT) 4 MG disintegrating tablet Take 1 tablet (4 mg total) by mouth every 8 (eight) hours as needed for nausea or vomiting.   No facility-administered encounter medications on file as of 01/31/2023.    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  Family History  Family history unknown: Yes    Social History   Socioeconomic History   Marital status: Single    Spouse name: Not on file   Number of children: Not on file   Years of education: Not on file   Highest education level: Not on file  Occupational History   Not on file  Tobacco Use   Smoking status: Every Day    Packs/day: 1.00    Years: 5.00    Additional pack years: 0.00    Total pack years: 5.00    Types: Cigarettes   Smokeless tobacco: Never  Vaping Use   Vaping Use: Never used  Substance and Sexual Activity   Alcohol use: Not Currently    Comment: occasionally   Drug use: Never   Sexual activity: Not Currently  Other Topics Concern   Not on file  Social History Narrative   Not on file   Social  Determinants of Health   Financial Resource Strain: High Risk (06/20/2022)   Overall Financial Resource Strain (CARDIA)    Difficulty of Paying Living Expenses: Very hard  Food Insecurity: Food Insecurity Present (06/20/2022)   Hunger Vital Sign    Worried About Running Out of Food in the Last Year: Often true    Ran Out of Food in the Last Year: Often true  Transportation Needs: Unmet Transportation Needs (06/20/2022)   PRAPARE - Hydrologist (Medical): Yes    Lack of Transportation (Non-Medical): Yes  Physical Activity: Inactive (06/20/2022)   Exercise Vital Sign    Days of Exercise per Week: 0 days    Minutes of Exercise per Session: 0 min  Stress: Stress Concern Present (06/20/2022)   [employer] [place]    Feeling of Stress : Rather much  Social Connections: Socially Isolated (06/20/2022)   Social Connection and Isolation Panel [NHANES]    Frequency of Communication with Friends and Family: Twice a week    Frequency of Social Gatherings with Friends and Family: Twice a week    Attends Religious Services: Never    Marine scientist or Organizations: No    Attends  Club or Organization Meetings: Never    Marital Status: Never married  Intimate Partner Violence: Not At Risk (06/20/2022)   Humiliation, Afraid, Rape, and Kick questionnaire    Fear of Current or Ex-Partner: No    Emotionally Abused: No    Physically Abused: No    Sexually Abused: No    ROS      Objective    There were no vitals taken for this visit.  Physical Exam  {Labs (Optional):23779}    Assessment & Plan:   Problem List Items Addressed This Visit   None   No follow-ups on file.   Asencion Noble, MD

## 2023-01-31 ENCOUNTER — Ambulatory Visit: Payer: Medicaid Other | Attending: Critical Care Medicine | Admitting: Critical Care Medicine

## 2023-01-31 ENCOUNTER — Other Ambulatory Visit: Payer: Self-pay

## 2023-01-31 ENCOUNTER — Other Ambulatory Visit (HOSPITAL_COMMUNITY): Payer: Self-pay

## 2023-01-31 ENCOUNTER — Encounter: Payer: Self-pay | Admitting: Critical Care Medicine

## 2023-01-31 VITALS — BP 115/77 | HR 65 | Ht 68.0 in | Wt 182.2 lb

## 2023-01-31 DIAGNOSIS — F3181 Bipolar II disorder: Secondary | ICD-10-CM | POA: Diagnosis not present

## 2023-01-31 DIAGNOSIS — F901 Attention-deficit hyperactivity disorder, predominantly hyperactive type: Secondary | ICD-10-CM | POA: Diagnosis not present

## 2023-01-31 DIAGNOSIS — F411 Generalized anxiety disorder: Secondary | ICD-10-CM | POA: Diagnosis not present

## 2023-01-31 DIAGNOSIS — H60312 Diffuse otitis externa, left ear: Secondary | ICD-10-CM

## 2023-01-31 DIAGNOSIS — Z1159 Encounter for screening for other viral diseases: Secondary | ICD-10-CM

## 2023-01-31 DIAGNOSIS — F17210 Nicotine dependence, cigarettes, uncomplicated: Secondary | ICD-10-CM

## 2023-01-31 DIAGNOSIS — K029 Dental caries, unspecified: Secondary | ICD-10-CM | POA: Diagnosis not present

## 2023-01-31 DIAGNOSIS — Z5901 Sheltered homelessness: Secondary | ICD-10-CM

## 2023-01-31 DIAGNOSIS — L708 Other acne: Secondary | ICD-10-CM | POA: Diagnosis not present

## 2023-01-31 DIAGNOSIS — Z62811 Personal history of psychological abuse in childhood: Secondary | ICD-10-CM

## 2023-01-31 DIAGNOSIS — Z139 Encounter for screening, unspecified: Secondary | ICD-10-CM

## 2023-01-31 DIAGNOSIS — F331 Major depressive disorder, recurrent, moderate: Secondary | ICD-10-CM

## 2023-01-31 DIAGNOSIS — T7492XA Unspecified child maltreatment, confirmed, initial encounter: Secondary | ICD-10-CM

## 2023-01-31 MED ORDER — CIPROFLOXACIN-DEXAMETHASONE 0.3-0.1 % OT SUSP
4.0000 [drp] | Freq: Two times a day (BID) | OTIC | 0 refills | Status: DC
  Filled 2023-01-31: qty 7.5, 7d supply, fill #0

## 2023-01-31 NOTE — Assessment & Plan Note (Signed)
Start Cipro dexamethasone eardrops to the left ear

## 2023-01-31 NOTE — Assessment & Plan Note (Signed)
Currently in a homeless shelter

## 2023-01-31 NOTE — Assessment & Plan Note (Signed)
Management per psychiatry 

## 2023-01-31 NOTE — Assessment & Plan Note (Signed)
Stable at this time monitor 
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
## 2023-01-31 NOTE — Patient Instructions (Signed)
Referral made to dental surgeon Dr Cherylin Mylar today for health screening  [facility]  See Dr Joya Gaskins here at clinic in 6 months

## 2023-01-31 NOTE — Assessment & Plan Note (Signed)
On Strattera management per psychiatry

## 2023-02-01 ENCOUNTER — Encounter: Payer: Self-pay | Admitting: Physician Assistant

## 2023-02-01 LAB — CBC WITH DIFFERENTIAL/PLATELET
Basophils Absolute: 0.1 10*3/uL (ref 0.0–0.2)
Basos: 1 %
EOS (ABSOLUTE): 0.2 10*3/uL (ref 0.0–0.4)
Eos: 2 %
Hematocrit: 50.4 % (ref 37.5–51.0)
Hemoglobin: 16.9 g/dL (ref 13.0–17.7)
Immature Grans (Abs): 0 10*3/uL (ref 0.0–0.1)
Immature Granulocytes: 0 %
Lymphocytes Absolute: 3.3 10*3/uL — ABNORMAL HIGH (ref 0.7–3.1)
Lymphs: 33 %
MCH: 30.7 pg (ref 26.6–33.0)
MCHC: 33.5 g/dL (ref 31.5–35.7)
MCV: 92 fL (ref 79–97)
Monocytes Absolute: 0.6 10*3/uL (ref 0.1–0.9)
Monocytes: 6 %
Neutrophils Absolute: 5.7 10*3/uL (ref 1.4–7.0)
Neutrophils: 58 %
Platelets: 238 10*3/uL (ref 150–450)
RBC: 5.5 x10E6/uL (ref 4.14–5.80)
RDW: 12.5 % (ref 11.6–15.4)
WBC: 9.9 10*3/uL (ref 3.4–10.8)

## 2023-02-01 LAB — COMPREHENSIVE METABOLIC PANEL
ALT: 22 IU/L (ref 0–44)
AST: 18 IU/L (ref 0–40)
Albumin/Globulin Ratio: 2.4 — ABNORMAL HIGH (ref 1.2–2.2)
Albumin: 4 g/dL — ABNORMAL LOW (ref 4.1–5.1)
Alkaline Phosphatase: 86 IU/L (ref 44–121)
BUN/Creatinine Ratio: 17 (ref 9–20)
BUN: 13 mg/dL (ref 6–20)
Bilirubin Total: 0.3 mg/dL (ref 0.0–1.2)
CO2: 23 mmol/L (ref 20–29)
Calcium: 9.2 mg/dL (ref 8.7–10.2)
Chloride: 106 mmol/L (ref 96–106)
Creatinine, Ser: 0.78 mg/dL (ref 0.76–1.27)
Globulin, Total: 1.7 g/dL (ref 1.5–4.5)
Glucose: 91 mg/dL (ref 70–99)
Potassium: 4.5 mmol/L (ref 3.5–5.2)
Sodium: 141 mmol/L (ref 134–144)
Total Protein: 5.7 g/dL — ABNORMAL LOW (ref 6.0–8.5)
eGFR: 122 mL/min/{1.73_m2} (ref >59)

## 2023-02-01 LAB — HCV INTERPRETATION

## 2023-02-01 LAB — HCV AB W REFLEX TO QUANT PCR: HCV Ab: NONREACTIVE

## 2023-02-01 NOTE — Progress Notes (Signed)
Pt aware of results 

## 2023-02-01 NOTE — Progress Notes (Signed)
Pt seen by Dr Joya Gaskins.   Labs were reviewed, normal.  Has not been using ear drops as many times a day as prescribed. Ear not bothering him.   L Ear still inflamed, but better than yesterday.   No other issues or concerns.  Rosaria Ferries, PA-C 02/01/2023 4:20 PM

## 2023-02-08 ENCOUNTER — Ambulatory Visit (INDEPENDENT_AMBULATORY_CARE_PROVIDER_SITE_OTHER): Payer: Medicaid Other | Admitting: Licensed Clinical Social Worker

## 2023-02-08 DIAGNOSIS — F3181 Bipolar II disorder: Secondary | ICD-10-CM

## 2023-02-08 NOTE — Progress Notes (Signed)
THERAPIST PROGRESS NOTE Virtual Visit via Video Note  I connected with Cameron Cruz on 02/08/23 at  3:00 PM EDT by a video enabled telemedicine application and verified that I am speaking with the correct person using two identifiers.  Location: Patient: Cameron Cruz  Provider: Providers Home    I discussed the limitations of evaluation and management by telemedicine and the availability of in person appointments. The patient expressed understanding and agreed to proceed.     I discussed the assessment and treatment plan with the patient. The patient was provided an opportunity to ask questions and all were answered. The patient agreed with the plan and demonstrated an understanding of the instructions.   The patient was advised to call back or seek an in-person evaluation if the symptoms worsen or if the condition fails to improve as anticipated.  I provided 45 minutes of non-face-to-face time during this encounter.   Dory Horn, LCSW   Participation Level: Active  Behavioral Response: CasualAlertAnxious and Depressed  Type of Therapy: Individual Therapy  Treatment Goals addressed: Treatment plan updated as patient has not been seen in over 6 months.  Patient would like to reestablish therapy after moving back to [facility].  ProgressTowards Goals: Initial  Interventions: CBT, Motivational Interviewing, Strength-based, and Supportive  Summary: Cameron Cruz is a 32 y.o. male who presents with depressed and anxious mood\affect.  Patient was pleasant, cooperative, maintained good eye contact.  He engaged well in therapy session was dressed casually.  Patient comes in today to reestablish care after moving away from [facility].  Patient reports for the past 6 months he has been homeless and was staying at the [facility] in [facility].  Cameron Cruz states that he was living out of [place] with his mom but it did not last more than 1 week before he came  back to [facility].    Patient states that he was not engaging in mental health services and could see his symptoms increasing.  Patient reports that he wants to reestablish care for medication management on December 05, 2022.  He has restarted his medications this past week.  LCSW did inquire why there was a delay between seeing medication provider and starting this medication regiment.  Patient reports part of it was transportation problems and financial problems.    Cameron Cruz reports that he is starting a new job at [employer].  Patient reports wanting to increase coping skills to help manage anger, ADHD, and bipolar disorder.  Patient reports reconnecting with his aunt which is his only support system in [place].   Suicidal/Homicidal: Nowithout intent/plan  Therapist Response:     Intervention/Plan: LCSW updated treatment plan to reflect new treatment goals.  LCSW reviewed goals with patient and patient was verbally agreeable.  Today LCSW psychoanalytic therapy for patient to express thoughts, feelings and emotions.  LCSW used unconditional positive regard utilizing nonjudgmental stance for person centered therapy.  Plan for patient is to follow-up with therapy once per month and continue to take her medication 7 out of 7 days/week.  Plan: Return again in 4 weeks.  Diagnosis: No diagnosis found.  Collaboration of Care: Other Pt to start back therapy as he moved back to [facility] was advised Release of Information must be obtained prior to any record release in order to collaborate their care with an outside provider. Patient/Guardian was advised if they have not already done so to contact the registration department  to sign all necessary forms in order for [place] to release information regarding their care.   Consent: Patient/Guardian gives verbal consent for treatment and assignment of benefits for services provided during this visit.  Patient/Guardian expressed understanding and agreed to proceed.   Dory Horn, LCSW 02/08/2023

## 2023-02-09 ENCOUNTER — Encounter (HOSPITAL_COMMUNITY): Payer: Medicaid Other | Admitting: Student

## 2023-02-21 ENCOUNTER — Telehealth: Payer: Self-pay | Admitting: Critical Care Medicine

## 2023-02-21 NOTE — Telephone Encounter (Signed)
Called patient and he was given the name and phone number to office

## 2023-02-21 NOTE — Telephone Encounter (Signed)
Copied from CRM [id number]. Topic: Referral - Status >> Feb 21, 2023  1:41 PM Cameron Cruz wrote: Reason for CRM: Pt is calling to check on the status of his dental referral.  Please call pt on his new number [phone].

## 2023-03-09 ENCOUNTER — Encounter (HOSPITAL_COMMUNITY): Payer: Self-pay

## 2023-03-09 ENCOUNTER — Encounter (HOSPITAL_COMMUNITY): Payer: Medicaid Other | Admitting: Student

## 2023-03-15 ENCOUNTER — Ambulatory Visit (INDEPENDENT_AMBULATORY_CARE_PROVIDER_SITE_OTHER): Payer: Medicaid Other | Admitting: Licensed Clinical Social Worker

## 2023-03-15 DIAGNOSIS — F3181 Bipolar II disorder: Secondary | ICD-10-CM | POA: Diagnosis not present

## 2023-03-15 NOTE — Progress Notes (Signed)
THERAPIST PROGRESS NOTE  Session Time: 30   Participation Level: Active  Behavioral Response: CasualAlertAnxious and Depressed  Type of Therapy: Individual Therapy  Treatment Goals addressed:  Active     Anxiety Disorder CCP Problem  1 GAD     LTG: Patient will score less than 5 on the Generalized Anxiety Disorder 7 Scale (GAD-7) (Progressing)     Start:  06/20/22    Expected End:  08/18/23       Goal Note     Increased from a 9 to 11          STG: Patient will complete at least 80% of assigned homework (Progressing)     Start:  06/20/22    Expected End:  08/18/23           Depression CCP Problem  1 MDD       Decrease PHQ-9 below 10 (Progressing)     Start:  06/20/22    Expected End:  08/18/23       Goal Note     Decreased from a 12 to a 10          Walk three times weekly  (Progressing)     Start:  06/20/22    Expected End:  08/18/23       Goal Note     Self reports walking 3 to 4 times per week.          identify 3 trigger of depression  (Progressing)     Start:  06/20/22    Expected End:  08/18/23       Goal Note     Work it is fast paced          STG: Cameron Cruz" WILL PARTICIPATE IN AT LEAST 80% OF SCHEDULED INDIVIDUAL PSYCHOTHERAPY SESSIONS (Progressing)     Start:  06/20/22    Expected End:  08/18/23            ProgressTowards Goals: Progressing  Interventions: CBT, Motivational Interviewing, and Supportive  Summary: Cameron Cruz is a 32 y.o. male who presents with depressed and anxious mood\affect.  Patient was pleasant, cooperative, maintained good eye contact.  He engaged well in therapy session was dressed casually.  Patient reports today that he missed his medication management appointment and asked LCSW to assist with rescheduling.  Patient was not over 10 minutes late for therapy session due to work and patient also claims that he no showed for his medication management due to work.  LCSW advised patient that if  appointments are scheduled it is his responsibility to make sure that he is offered to notify us as work to be able to attend appointments.  LCSW notified patient that too many no-shows can result in walk-in appointments only at [facility].  [name] reports understanding of policies and procedures at [facility]  LCSW rescheduled patient for June 13 at 1 PM with medication provider.   Patient reports increased anxiety for tension and worry due to fast-paced work.  This is as evidenced by GAD-7 increasing.  Patient reports decreased depression as evidenced by decrease in PHQ-9.  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  Patient self reports that finding independent housing has decreased depression symptoms for worthlessness and hopelessness. Suicidal/Homicidal: Nowithout intent/plan  Therapist Response:     Intervention/Plan: LCSW psychoanalytic therapy for patient to express thoughts, feelings and concerns.  LCSW supportive therapy for praise and encouragement.  LCSW administered the GAD-7 and  PHQ-9 as listed above.  LCSW reviewed scores with patient.  LCSW solution-focused therapy to reschedule patient's appointment with medication provider at [facility].  Plan for patient is to abide by cancellation and no-show policy at [facility]  Plan: Return again in 3 weeks.  Diagnosis: Bipolar 2 disorder, major depressive episode (HCC)  Collaboration of Care: Other None today   Patient/Guardian was advised Release of Information must be obtained prior to any record release in order to collaborate their care with an outside provider. Patient/Guardian was advised if they have not already done so to contact the registration department to sign all necessary forms in order for [place] to release information regarding their care.   Consent: Patient/Guardian gives verbal consent for treatment  and assignment of benefits for services provided during this visit. Patient/Guardian expressed understanding and agreed to proceed.   Weber Cooks, LCSW 03/15/2023

## 2023-04-04 ENCOUNTER — Ambulatory Visit (INDEPENDENT_AMBULATORY_CARE_PROVIDER_SITE_OTHER): Payer: Medicaid Other | Admitting: Licensed Clinical Social Worker

## 2023-04-04 DIAGNOSIS — F411 Generalized anxiety disorder: Secondary | ICD-10-CM

## 2023-04-04 DIAGNOSIS — F3181 Bipolar II disorder: Secondary | ICD-10-CM

## 2023-04-04 NOTE — Progress Notes (Signed)
THERAPIST PROGRESS NOTE  Virtual Visit via Video Note  I connected with [name] on 04/04/23 at  9:00 AM EDT by a video enabled telemedicine application and verified that I am speaking with the correct person using two identifiers.  Location: Patient: [facility]  Provider: Providers Home    I discussed the limitations of evaluation and management by telemedicine and the availability of in person appointments. The patient expressed understanding and agreed to proceed.     I discussed the assessment and treatment plan with the patient. The patient was provided an opportunity to ask questions and all were answered. The patient agreed with the plan and demonstrated an understanding of the instructions.   The patient was advised to call back or seek an in-person evaluation if the symptoms worsen or if the condition fails to improve as anticipated.  I provided 30 minutes of non-face-to-face time during this encounter.   Weber Cooks, LCSW   Participation Level: Active  Behavioral Response: CasualAlertEuthymic  Type of Therapy: Individual Therapy  Treatment Goals addressed:  Active     Anxiety Disorder CCP Problem  1 GAD     LTG: Patient will score less than 5 on the Generalized Anxiety Disorder 7 Scale (GAD-7) (Completed/Met)     Start:  06/20/22    Expected End:  08/18/23    Resolved:  04/04/23      STG: Patient will complete at least 80% of assigned homework (Progressing)     Start:  06/20/22    Expected End:  08/18/23           Depression CCP Problem  1 MDD       Decrease PHQ-9 below 10 (Completed/Met)     Start:  06/20/22    Expected End:  08/18/23    Resolved:  04/04/23      Walk three times weekly  (Not Progressing)     Start:  06/20/22    Expected End:  08/18/23         identify 3 trigger of depression  (Progressing)     Start:  06/20/22    Expected End:  08/18/23       Goal Note     Transportation  Conflict in personal life           STG: [name] "WUJ" WILL PARTICIPATE IN AT LEAST 80% OF SCHEDULED INDIVIDUAL PSYCHOTHERAPY SESSIONS (Progressing)     Start:  06/20/22    Expected End:  08/18/23             ProgressTowards Goals: Progressing  Interventions: CBT and Motivational Interviewing   Suicidal/Homicidal: Nowithout intent/plan  Therapist Response:     Pt was alert and oriented x 5. He was dressed casually and engaged well in therapy session. [name] presented with euthymic mood/affect. He was pleasant, cooperative and maintained good eye contact.  Pt reports things have been going overall well. [name] states taking medications has stabilized his mood. Pt reports coping skills such as playing video games, He reports improved support system with his mother and aunt. Pt states that he still has stabilized housing. Pt reports decrease in overall depression and anxiety for tension, worry, and irritability. [name] reports no symptoms for mania or hypomania currently.  Interventions/Plan: LCSW used psychoanalytic therapy for pt to express thoughts, feeling and emotions. LCSW educated pt on triggers for depression, pt self-reports conflict and transportation and triggers. Pt reports adding coping skills such as playing video games. LCSW administered a GAD-7. LCSW administer a  PHQ-9. LCSW notes goals met for both PHQ-9 and GAD-7.   Plan: Return again in 3 weeks.  Diagnosis: Bipolar 2 disorder, major depressive episode (HCC)  Generalized anxiety disorder  Collaboration of Care: Other None today   Patient/Guardian was advised Release of Information must be obtained prior to any record release in order to collaborate their care with an outside provider. Patient/Guardian was advised if they have not already done so to contact the registration department to sign all necessary forms in order for [place] to release information regarding their care.   Consent: Patient/Guardian gives verbal consent for treatment and assignment of  benefits for services provided during this visit. Patient/Guardian expressed understanding and agreed to proceed.   Weber Cooks, LCSW 04/04/2023

## 2023-04-13 ENCOUNTER — Other Ambulatory Visit: Payer: Self-pay

## 2023-04-13 ENCOUNTER — Encounter (HOSPITAL_COMMUNITY): Payer: Self-pay | Admitting: Emergency Medicine

## 2023-04-13 ENCOUNTER — Other Ambulatory Visit (HOSPITAL_COMMUNITY): Payer: Self-pay

## 2023-04-13 ENCOUNTER — Telehealth (HOSPITAL_COMMUNITY): Payer: Self-pay | Admitting: Emergency Medicine

## 2023-04-13 ENCOUNTER — Ambulatory Visit (HOSPITAL_COMMUNITY)
Admission: EM | Admit: 2023-04-13 | Discharge: 2023-04-13 | Disposition: A | Discharge status: Home / Self Care | Payer: Medicaid Other | Attending: Internal Medicine | Admitting: Internal Medicine

## 2023-04-13 DIAGNOSIS — I808 Phlebitis and thrombophlebitis of other sites: Secondary | ICD-10-CM | POA: Diagnosis not present

## 2023-04-13 MED ORDER — CEPHALEXIN 500 MG PO CAPS: 500.0000 mg | ORAL_CAPSULE | Freq: Three times a day (TID) | ORAL | 0 refills | Status: DC

## 2023-04-13 MED ORDER — CEPHALEXIN 500 MG PO CAPS
500.0000 mg | ORAL_CAPSULE | Freq: Three times a day (TID) | ORAL | 0 refills | Status: AC
  Filled 2023-04-13: qty 15, 5d supply, fill #0

## 2023-04-13 NOTE — Discharge Instructions (Signed)
The redness and swelling around the site of your recent plasma donation appears infected and inflamed.  Please take Keflex antibiotic every 8 hours (with breakfast, lunch, and dinner) for the next 5 days.  Apply warm compresses to the site to reduce swelling and irritation.  The inflammation underneath the skin will dissolve on its own over the next 3 to 5 days.  Avoid donating plasma for the next 1 week while the site heals.  If you develop any new or worsening symptoms or do not improve in the next 2 to 3 days, please return.  If your symptoms are severe, please go to the emergency room.  Follow-up with your primary care provider for further evaluation and management of your symptoms as well as ongoing wellness visits.  I hope you feel better!

## 2023-04-13 NOTE — ED Triage Notes (Signed)
Patient states he donated plasma 2 days ago and the IV area is red and tender to touch, he is having concerns of infection. Pt requesting a note for work.
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
## 2023-04-27 ENCOUNTER — Encounter (HOSPITAL_COMMUNITY): Payer: Medicaid Other | Admitting: Student

## 2023-04-27 ENCOUNTER — Telehealth (HOSPITAL_COMMUNITY): Payer: Self-pay | Admitting: Licensed Clinical Social Worker

## 2023-04-27 ENCOUNTER — Ambulatory Visit (HOSPITAL_COMMUNITY): Payer: 59 | Admitting: Licensed Clinical Social Worker

## 2023-04-27 ENCOUNTER — Encounter (HOSPITAL_COMMUNITY): Payer: Self-pay

## 2023-04-27 NOTE — Telephone Encounter (Signed)
LCSW sent two links t pt phone with no response. LCSW f/u with PC and had to leave VM. LCSW waited until 0915 before disconnecting

## 2023-05-16 ENCOUNTER — Other Ambulatory Visit (HOSPITAL_COMMUNITY): Payer: Self-pay

## 2023-05-16 MED ORDER — AMOXICILLIN 500 MG PO CAPS
500.0000 mg | ORAL_CAPSULE | Freq: Three times a day (TID) | ORAL | 0 refills | Status: DC
  Filled 2023-05-16 – 2023-05-31 (×2): qty 21, 7d supply, fill #0

## 2023-05-17 ENCOUNTER — Other Ambulatory Visit (HOSPITAL_COMMUNITY): Payer: Self-pay

## 2023-05-25 ENCOUNTER — Ambulatory Visit (INDEPENDENT_AMBULATORY_CARE_PROVIDER_SITE_OTHER): Payer: 59 | Admitting: Licensed Clinical Social Worker

## 2023-05-25 DIAGNOSIS — F3181 Bipolar II disorder: Secondary | ICD-10-CM

## 2023-05-25 NOTE — Progress Notes (Signed)
THERAPIST PROGRESS NOTE  Virtual Visit via Video Note  I connected with Cameron Cruz on 05/25/23 at  9:00 AM EDT by a video enabled telemedicine application and verified that I am speaking with the correct person using two identifiers.  Location: Patient: [facility]  Provider: Providers Home    I discussed the limitations of evaluation and management by telemedicine and the availability of in person appointments. The patient expressed understanding and agreed to proceed.     I discussed the assessment and treatment plan with the patient. The patient was provided an opportunity to ask questions and all were answered. The patient agreed with the plan and demonstrated an understanding of the instructions.   The patient was advised to call back or seek an in-person evaluation if the symptoms worsen or if the condition fails to improve as anticipated.  I provided 30 minutes of non-face-to-face time during this encounter.   Cameron Cooks, LCSW   Participation Level: Active  Behavioral Response: CasualAlertEuthymic  Type of Therapy: Individual Therapy  Treatment Goals addressed:  Active     Depression CCP Problem  1 MDD       Decrease PHQ-9 below 10 (Completed/Met)     Start:  06/20/22    Expected End:  08/18/23    Resolved:  04/04/23      Walk three times weekly  (Completed/Met)     Start:  06/20/22    Expected End:  08/18/23    Resolved:  05/25/23    Goal Note     Pt reports he is walking daily to work.          identify 3 trigger of depression  (Completed/Met)     Start:  06/20/22    Expected End:  08/18/23    Resolved:  05/25/23    Goal Note     Financials  Anger  Family conflict          STG: Cameron Cruz" WILL PARTICIPATE IN AT LEAST 80% OF SCHEDULED INDIVIDUAL PSYCHOTHERAPY SESSIONS (Completed/Met)     Start:  06/20/22    Expected End:  08/18/23    Resolved:  05/25/23    Goal Note     Per chart review          ENCOURAGE Cameron Cruz  "Cameron Cruz" TO PARTICIPATE IN RECOVERY PEER SUPPORT ACTIVITIES WEEKLY     Start:  06/20/22         WORK WITH Cameron Cruz "Cameron Cruz" TO TRACK SYMPTOMS, [name], DIARY CARD, [name] (Completed)     Start:  06/20/22    End:  03/15/23      Administer the PHQ-9 or MADRS weekly for 4 weeks (Completed)     Start:  06/20/22    End:  03/15/23      PROVIDE Cameron Cruz "Cameron Cruz" WITH EDUCATIONAL INFORMATION AND READING MATERIAL ON DISSOCIATION, ITS CAUSES, AND SYMPTOMS (Completed)     Start:  06/20/22    End:  05/25/23      WORK WITH Cameron Cruz "Cameron Cruz" TO IDENTIFY THE MAJOR COMPONENTS OF A RECENT EPISODE OF DEPRESSION: PHYSICAL SYMPTOMS, [name], [name] (Completed)     Start:  06/20/22    End:  05/25/23       Resolved     Anxiety Disorder CCP Problem  1 GAD     LTG: Patient will score less than 5 on the Generalized Anxiety Disorder 7 Scale (GAD-7) (Completed/Met)     Start:  06/20/22    Expected End:  08/18/23    Resolved:  04/04/23      STG: Patient will complete at least 80% of assigned homework (Completed/Met)     Start:  06/20/22    Expected End:  08/18/23    Resolved:  05/25/23    Goal Note     Per self reporting          Discuss risks and benefits of medication treatment options for this problem and prescribe as indicated (Completed)     Start:  06/20/22    End:  03/15/23      Encourage patient to take psychotropic medication as prescribed (Completed)     Start:  06/20/22    End:  03/15/23         ProgressTowards Goals: Met  Interventions: Supportive and Reframing   Suicidal/Homicidal: Nowithout intent/plan  Therapist Response:     Pt was alert and oriented x 5. He was dressed casually and engaged well in therapy. Cameron Cruz was pleasant, cooperative, and maintained good eye contact. He presented with euthymic mood/affect.    Pt reports stressor of family and housing. Cameron Cruz reports he has an overall  decrease in anxiety, depression and anger. Pt states goals have been met in treatment plan. LCSW and pt discussed last session pt discharging from therapy and pt was agreeable based on improved and goals met in treatment documented above.   Cameron Cruz states he is currently working with low-income housing agency to get out of his aunt's house. He does report full time employment at [employer]. Pt reports he sometimes struggles with setting boundaries with his aunt as she is older and wants to spend time with him doing things such as bingo but pt does not enjoy. Cameron Cruz states "I just do not want to let her down".   Interventions: LCSW educated pt on communication skills for such as tine of voice, body language, and compromise. LCSW educated pt on treatment goals met today. LCSW advised pt to keep taking medications as directed. LCSW provided pt resources for re assessment if needed.   Plan: Pt to discharge from therapy due to goals being met.   Diagnosis: Bipolar 2 disorder, major depressive episode (HCC)  Collaboration of Care: Other None today   Patient/Guardian was advised Release of Information must be obtained prior to any record release in order to collaborate their care with an outside provider. Patient/Guardian was advised if they have not already done so to contact the registration department to sign all necessary forms in order for [place] to release information regarding their care.   Consent: Patient/Guardian gives verbal consent for treatment and assignment of benefits for services provided during this visit. Patient/Guardian expressed understanding and agreed to proceed.   Cameron Cooks, LCSW 05/25/2023

## 2023-05-29 ENCOUNTER — Other Ambulatory Visit (HOSPITAL_COMMUNITY): Payer: Self-pay

## 2023-05-31 ENCOUNTER — Other Ambulatory Visit (HOSPITAL_COMMUNITY): Payer: Self-pay

## 2023-06-02 ENCOUNTER — Encounter (HOSPITAL_COMMUNITY): Payer: 59 | Admitting: Student

## 2023-06-05 ENCOUNTER — Other Ambulatory Visit (HOSPITAL_COMMUNITY): Payer: Self-pay

## 2023-06-06 ENCOUNTER — Encounter (HOSPITAL_COMMUNITY): Payer: 59 | Admitting: Student

## 2023-06-06 ENCOUNTER — Encounter (HOSPITAL_COMMUNITY): Payer: Self-pay

## 2023-06-13 ENCOUNTER — Other Ambulatory Visit (HOSPITAL_COMMUNITY): Payer: Self-pay
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
## 2023-07-25 ENCOUNTER — Ambulatory Visit: Payer: Medicaid Other | Admitting: Critical Care Medicine

## 2023-09-04 ENCOUNTER — Ambulatory Visit: Payer: Medicaid Other | Admitting: Nurse Practitioner
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
## 2023-11-30 ENCOUNTER — Ambulatory Visit: Payer: 59 | Admitting: Critical Care Medicine

## 2023-12-08 ENCOUNTER — Ambulatory Visit: Payer: 59 | Admitting: Internal Medicine

## 2023-12-08 ENCOUNTER — Ambulatory Visit: Payer: 59 | Admitting: Nurse Practitioner

## 2023-12-21 ENCOUNTER — Encounter: Payer: Self-pay | Admitting: Emergency Medicine

## 2023-12-21 ENCOUNTER — Ambulatory Visit
Admission: EM | Admit: 2023-12-21 | Discharge: 2023-12-21 | Disposition: A | Discharge status: Home / Self Care | Payer: 59 | Attending: Family Medicine | Admitting: Family Medicine

## 2023-12-21 DIAGNOSIS — Z711 Person with feared health complaint in whom no diagnosis is made: Secondary | ICD-10-CM

## 2023-12-21 NOTE — ED Triage Notes (Signed)
 Pt is here today to have his arm examined to be sure he can donate plasma at [facility]. Pt has a form in hand that needs to be signed by a physician.

## 2023-12-21 NOTE — ED Provider Notes (Signed)
 [facility]  Note:  This document was prepared using Conservation officer, historic buildings and may include unintentional dictation errors.  MRN: [id number] DOB: 22-Jul-1991  Subjective:   Cameron Cruz is a 33 y.o. male presenting for an evaluation to donate plasma.  Patient has previously donated without any issues.  Has had negative hepatitis C testing in the past year.  Has had HIV and syphilis testing in the past 2 years.  No testing is required by the facility.  The request is for a physical exam of the arm to be able to donate as well as a current list of medications.  No chronic medications.  No Known Allergies  Past Medical History:  Diagnosis Date   Anxiety    Attention deficit disorder as a child   Bipolar disorder (HCC)    Depression      History reviewed. No pertinent surgical history.  Family History  Family history unknown: Yes    Social History   Tobacco Use   Smoking status: Every Day    Current packs/day: 1.00    Average packs/day: 1 pack/day for 5.0 years (5.0 ttl pk-yrs)    Types: Cigarettes   Smokeless tobacco: Never  Vaping Use   Vaping status: Never Used  Substance Use Topics   Alcohol use: Not Currently    Comment: occasionally   Drug use: Never    ROS   Objective:   Vitals: BP 121/79 (BP Location: Left Arm)   Pulse 87   Temp 98.7 F (37.1 C) (Oral)   Resp 18   Ht 5' 8 (1.727 m)   Wt 182 lb 15.7 oz (83 kg)   SpO2 95%   BMI 27.82 kg/m   Physical Exam Constitutional:      General: He is not in acute distress.    Appearance: Normal appearance. He is well-developed and normal weight. He is not ill-appearing, toxic-appearing or diaphoretic.  HENT:     Head: Normocephalic and atraumatic.     Right Ear: External ear normal.     Left Ear: External ear normal.     Nose: Nose normal.     Mouth/Throat:     Pharynx: Oropharynx is clear.  Eyes:     General: No scleral icterus.       Right eye: No discharge.         Left eye: No discharge.     Extraocular Movements: Extraocular movements intact.  Cardiovascular:     Rate and Rhythm: Normal rate.  Pulmonary:     Effort: Pulmonary effort is normal.  Musculoskeletal:     Right elbow: No swelling, deformity, effusion or lacerations. Normal range of motion. No tenderness.     Left elbow: No swelling, deformity, effusion or lacerations. Normal range of motion. No tenderness.     Cervical back: Normal range of motion.  Skin:    General: Skin is warm and dry.     Findings: No rash.  Neurological:     Mental Status: He is alert and oriented to person, place, and time.  Psychiatric:        Mood and Affect: Mood normal.        Behavior: Behavior normal.        Thought Content: Thought content normal.        Judgment: Judgment normal.     Assessment and Plan :   PDMP not reviewed this encounter.  1. Physically well but worried    Documentation provided to  the patient that he could donate plasma.   Christopher Savannah, [place] 12/21/23 1529
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
## 2024-01-25 ENCOUNTER — Ambulatory Visit: Payer: 59 | Attending: Critical Care Medicine | Admitting: Critical Care Medicine

## 2024-01-25 ENCOUNTER — Encounter: Payer: Self-pay | Admitting: Critical Care Medicine

## 2024-01-25 VITALS — BP 121/77 | HR 78 | Ht 68.0 in | Wt 195.6 lb

## 2024-01-25 DIAGNOSIS — F3181 Bipolar II disorder: Secondary | ICD-10-CM

## 2024-01-25 DIAGNOSIS — K029 Dental caries, unspecified: Secondary | ICD-10-CM

## 2024-01-25 DIAGNOSIS — Z5901 Sheltered homelessness: Secondary | ICD-10-CM | POA: Diagnosis not present

## 2024-01-25 DIAGNOSIS — F331 Major depressive disorder, recurrent, moderate: Secondary | ICD-10-CM

## 2024-01-25 DIAGNOSIS — M545 Low back pain, unspecified: Secondary | ICD-10-CM | POA: Diagnosis not present

## 2024-01-25 DIAGNOSIS — F901 Attention-deficit hyperactivity disorder, predominantly hyperactive type: Secondary | ICD-10-CM

## 2024-01-25 MED ORDER — METHOCARBAMOL 500 MG PO TABS
500.0000 mg | ORAL_TABLET | Freq: Four times a day (QID) | ORAL | 1 refills | Status: DC | PRN
Start: 2024-01-25 — End: 2024-03-16

## 2024-01-25 MED ORDER — NAPROXEN 500 MG PO TABS: 500.0000 mg | ORAL_TABLET | Freq: Two times a day (BID) | ORAL | 1 refills | Status: DC | PRN

## 2024-01-25 NOTE — Assessment & Plan Note (Signed)
Care per mental health 

## 2024-01-25 NOTE — Assessment & Plan Note (Signed)
 Has mental health follow-up

## 2024-01-25 NOTE — Assessment & Plan Note (Signed)
 Begin methocarbamol as needed and use naproxen for pain Obtain x-ray of back

## 2024-01-25 NOTE — Assessment & Plan Note (Signed)
 Lives now with his aunt

## 2024-01-25 NOTE — Assessment & Plan Note (Signed)
 Declines dental referral at this time

## 2024-01-25 NOTE — Patient Instructions (Signed)
 X-ray of your back will be ordered  You can go to [facility] radiology [address] this is a walk-in appointment do not need a scheduled appointment  Medications given for your back  At some point in the future will need a dental referral  Watch your diet try to improve your diet with more of a plant-based diet is noted below because you have fat in your liver and gallstones  Return to see Dr. Delford Field in 3 months we will call you with results we may need to refer you to a back specialist

## 2024-02-16 ENCOUNTER — Emergency Department (HOSPITAL_COMMUNITY)
Admission: EM | Admit: 2024-02-16 | Discharge: 2024-02-16 | Disposition: A | Discharge status: Home / Self Care | Payer: MEDICAID | Attending: Emergency Medicine | Admitting: Emergency Medicine

## 2024-02-16 ENCOUNTER — Encounter (HOSPITAL_COMMUNITY): Payer: Self-pay

## 2024-02-16 ENCOUNTER — Other Ambulatory Visit: Payer: Self-pay

## 2024-02-16 DIAGNOSIS — K029 Dental caries, unspecified: Secondary | ICD-10-CM | POA: Insufficient documentation

## 2024-02-16 DIAGNOSIS — K0889 Other specified disorders of teeth and supporting structures: Secondary | ICD-10-CM

## 2024-02-16 DIAGNOSIS — K047 Periapical abscess without sinus: Secondary | ICD-10-CM | POA: Diagnosis not present

## 2024-02-16 MED ORDER — IBUPROFEN 400 MG PO TABS
400.0000 mg | ORAL_TABLET | Freq: Once | ORAL | Status: AC
  Administered 2024-02-16: 400 mg via ORAL
  Filled 2024-02-16: qty 1

## 2024-02-16 MED ORDER — ACETAMINOPHEN 325 MG PO TABS
650.0000 mg | ORAL_TABLET | Freq: Once | ORAL | Status: AC
  Administered 2024-02-16: 650 mg via ORAL
  Filled 2024-02-16: qty 2

## 2024-02-16 MED ORDER — AMOXICILLIN 500 MG PO CAPS
500.0000 mg | ORAL_CAPSULE | Freq: Once | ORAL | Status: AC
  Administered 2024-02-16: 500 mg via ORAL
  Filled 2024-02-16: qty 1

## 2024-02-16 MED ORDER — AMOXICILLIN 500 MG PO CAPS: 500.0000 mg | ORAL_CAPSULE | Freq: Three times a day (TID) | ORAL | 0 refills | Status: DC

## 2024-02-16 MED ORDER — TRAMADOL HCL 50 MG PO TABS: 50.0000 mg | ORAL_TABLET | Freq: Four times a day (QID) | ORAL | 0 refills | Status: DC | PRN

## 2024-02-16 NOTE — Discharge Instructions (Addendum)
 It was our pleasure to provide your ER care today - we hope that you feel better.  Take amoxicillin as prescribed. Take acetaminophen or ibuprofen as need for pain. You may also take ultram as need for pain - no driving when taking ultram.   Follow up closely with dentist in the coming week - see attached info - call office to arrange appointment time.   Return to ER if worse, new symptoms, fevers, intractable pain, severe facial swelling, trouble breathing or swallowing, or other emergency concern.

## 2024-02-16 NOTE — ED Triage Notes (Signed)
 Pt states front teeth are hurting him and has been going on for 4 days. Pt states its shooting pain to sinuses. Axox4.

## 2024-02-16 NOTE — ED Provider Notes (Signed)
 [place] EMERGENCY DEPARTMENT AT [facility] Provider Note   CSN: [id number] Arrival date & time: 02/16/24  1542     History  Chief Complaint  Patient presents with   Dental Pain    Cameron Cruz is a 33 y.o. male.  Pt with c/o pain to upper tooth/gum area in region teeth 9/10 in past couple weeks. No recent trauma. Pt with multiple dental caries in mouth and several cracked/broken or absent teeth on chronic basis. No local dentist. No fever or chills. No sore throat. No neck pain. No headache.   The history is provided by the patient and medical records.  Dental Pain Associated symptoms: no fever and no headaches        Home Medications Prior to Admission medications   Medication Sig Start Date End Date Taking? Authorizing Provider  methocarbamol (ROBAXIN) 500 MG tablet Take 1 tablet (500 mg total) by mouth every 6 (six) hours as needed for muscle spasms. 01/25/24   Storm Frisk, MD  naproxen (NAPROSYN) 500 MG tablet Take 1 tablet (500 mg total) by mouth 2 (two) times daily as needed. 01/25/24   Storm Frisk, MD      Allergies    Patient has no known allergies.    Review of Systems   Review of Systems  Constitutional:  Negative for fever.  HENT:  Positive for dental problem. Negative for sore throat and trouble swallowing.   Eyes:  Negative for pain and redness.  Respiratory:  Negative for shortness of breath.   Neurological:  Negative for headaches.    Physical Exam Updated Vital Signs BP 137/88 (BP Location: Left Arm)   Pulse 96   Temp 98.4 F (36.9 C)   Resp 16   Ht 1.727 m (5\' 8" )   Wt 88 kg   SpO2 96%   BMI 29.50 kg/m  Physical Exam Vitals and nursing note reviewed.  Constitutional:      Appearance: Normal appearance. He is well-developed.  HENT:     Head: Atraumatic.     Nose: Nose normal.     Mouth/Throat:     Mouth: Mucous membranes are moist.     Pharynx: Oropharynx is clear.     Comments: Multiple dental caries,  chronically cracked/broken off and absent teeth. In region teeth 9/10, mild gum swelling and tenderness. Those teeth are firmly intact. No fluctuance or drainable abscess noted.  No trismus. Pharynx normal. Floor of mouth and neck appear normal, no pain or tenderness. No facial cellulitis noted.  Eyes:     General: No scleral icterus.    Conjunctiva/sclera: Conjunctivae normal.     Pupils: Pupils are equal, round, and reactive to light.  Neck:     Trachea: No tracheal deviation.  Cardiovascular:     Rate and Rhythm: Normal rate.     Pulses: Normal pulses.  Pulmonary:     Effort: Pulmonary effort is normal. No accessory muscle usage or respiratory distress.  Musculoskeletal:        General: No swelling.     Cervical back: Normal range of motion and neck supple. No rigidity.  Lymphadenopathy:     Cervical: No cervical adenopathy.  Skin:    General: Skin is warm and dry.     Findings: No rash.  Neurological:     Mental Status: He is alert.     Comments: Alert, speech clear. Steady gait.   Psychiatric:        Mood and Affect: Mood normal.  ED Results / Procedures / Treatments   Labs (all labs ordered are listed, but only abnormal results are displayed) Labs Reviewed - No data to display  EKG None  Radiology No results found.  Procedures Procedures    Medications Ordered in ED Medications  amoxicillin (AMOXIL) capsule 500 mg (has no administration in time range)  acetaminophen (TYLENOL) tablet 650 mg (has no administration in time range)  ibuprofen (ADVIL) tablet 400 mg (has no administration in time range)    ED Course/ Medical Decision Making/ A&P                                 Medical Decision Making Problems Addressed: Dental abscess: acute illness or injury Dental caries: acute illness or injury    Details: Acute on chronic Pain, dental: acute illness or injury    Details: Acute on chronic  Amount and/or Complexity of Data Reviewed External Data  Reviewed: notes.  Risk OTC drugs. Prescription drug management.   Confirmed nkda. No meds pta.   Reviewed nursing notes and prior charts for additional history.   Acetaminophen po, ibuprofen po, amox po.   Pt requests teeth be pulled in ED - I explained (several times) that we do not pull firmly intact teeth in ED, and that what we can do is start on meds and rec close dental f/u in coming week, with resources provided.   Pt appears stable for ED d/c.           Final Clinical Impression(s) / ED Diagnoses Final diagnoses:  None    Rx / DC Orders ED Discharge Orders     None         Cathren Laine, MD 02/16/24 1720

## 2024-03-16 ENCOUNTER — Telehealth: Payer: MEDICAID | Admitting: Nurse Practitioner

## 2024-03-16 DIAGNOSIS — M545 Low back pain, unspecified: Secondary | ICD-10-CM

## 2024-03-16 MED ORDER — NAPROXEN 500 MG PO TABS: 500.0000 mg | ORAL_TABLET | Freq: Two times a day (BID) | ORAL | 0 refills | Status: DC | PRN

## 2024-03-16 MED ORDER — METHOCARBAMOL 500 MG PO TABS: 500.0000 mg | ORAL_TABLET | Freq: Three times a day (TID) | ORAL | 0 refills | Status: DC | PRN

## 2024-03-16 NOTE — Patient Instructions (Signed)
  Cameron Cruz, thank you for joining [name], NP for today's virtual visit.  While this provider is not your primary care provider (PCP), if your PCP is located in our provider database this encounter information will be shared with them immediately following your visit.   A [place] MyChart account gives you access to today's visit and all your visits, tests, and labs performed at [facility] " click here if you don't have a [place] [place] MyChart account or go to [email or web address]  Consent: (Patient) Cameron Cruz provided verbal consent for this virtual visit at the beginning of the encounter.  Current Medications:  Current Outpatient Medications:    methocarbamol  (ROBAXIN ) 500 MG tablet, Take 1 tablet (500 mg total) by mouth every 8 (eight) hours as needed for muscle spasms., Disp: 60 tablet, Rfl: 0   naproxen  (NAPROSYN ) 500 MG tablet, Take 1 tablet (500 mg total) by mouth 2 (two) times daily as needed., Disp: 60 tablet, Rfl: 0   Medications ordered in this encounter:  Meds ordered this encounter  Medications   naproxen  (NAPROSYN ) 500 MG tablet    Sig: Take 1 tablet (500 mg total) by mouth 2 (two) times daily as needed.    Dispense:  60 tablet    Refill:  0    Supervising Provider:   LAMPTEY, PHILIP O [[id number]]   methocarbamol  (ROBAXIN ) 500 MG tablet    Sig: Take 1 tablet (500 mg total) by mouth every 8 (eight) hours as needed for muscle spasms.    Dispense:  60 tablet    Refill:  0    Supervising Provider:   Corine Dice [[id number]]     *If you need refills on other medications prior to your next appointment, please contact your pharmacy*  Follow-Up: Call back or seek an in-person evaluation if the symptoms worsen or if the condition fails to improve as anticipated.  [facility] [phone]  Other Instructions May alternate with heat and ice application for pain relief. May also alternate with acetaminophen  as  prescribed for back pain. Other alternatives include massage, acupuncture and water aerobics.  You must stay active and avoid a sedentary lifestyle.   If you have been instructed to have an in-person evaluation today at a local Urgent Care facility, please use the link below. It will take you to a list of all of our available [facility], including address, phone number and hours of operation. Please do not delay care.  [facility]  If you or a family member do not have a primary care provider, use the link below to schedule a visit and establish care. When you choose a [facility] primary care physician or advanced practice provider, you gain a long-term partner in health. Find a Primary Care Provider  Learn more about [place]'s in-office and virtual care options: [place] - Get Care Now
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
## 2024-03-28 ENCOUNTER — Telehealth: Payer: Self-pay | Admitting: Critical Care Medicine

## 2024-03-28 ENCOUNTER — Ambulatory Visit
Admission: RE | Admit: 2024-03-28 | Discharge: 2024-03-28 | Disposition: A | Discharge status: Home / Self Care | Payer: MEDICAID | Source: Ambulatory Visit | Attending: Critical Care Medicine | Admitting: Critical Care Medicine

## 2024-03-28 ENCOUNTER — Ambulatory Visit: Payer: Self-pay | Admitting: Physician Assistant

## 2024-03-28 ENCOUNTER — Other Ambulatory Visit: Payer: Self-pay | Admitting: Physician Assistant

## 2024-03-28 DIAGNOSIS — M545 Low back pain, unspecified: Secondary | ICD-10-CM

## 2024-03-28 NOTE — Telephone Encounter (Signed)
 Dr. Brent Cambric patient, patient last saw Ms. Jamal Mays 03/16/2024  Copied from CRM [id number]. Topic: Clinical - Lab/Test Results >> Mar 28, 2024 10:01 AM Rennis Case wrote: Reason for CRM: Patient states [facility] should be sending xray to office. Patient is requesting a phone call and not a mychart message once the results are available.   Best contact number: [phone]

## 2024-04-03 ENCOUNTER — Encounter (HOSPITAL_COMMUNITY): Payer: Self-pay

## 2024-04-03 ENCOUNTER — Ambulatory Visit (HOSPITAL_COMMUNITY): Payer: MEDICAID | Admitting: Licensed Clinical Social Worker

## 2024-04-15 ENCOUNTER — Encounter: Payer: Self-pay | Admitting: Physician Assistant

## 2024-04-15 ENCOUNTER — Ambulatory Visit (INDEPENDENT_AMBULATORY_CARE_PROVIDER_SITE_OTHER): Payer: MEDICAID | Admitting: Physician Assistant

## 2024-04-15 DIAGNOSIS — G8929 Other chronic pain: Secondary | ICD-10-CM | POA: Diagnosis not present

## 2024-04-15 DIAGNOSIS — M545 Low back pain, unspecified: Secondary | ICD-10-CM | POA: Diagnosis not present
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
## 2024-05-06 ENCOUNTER — Ambulatory Visit (INDEPENDENT_AMBULATORY_CARE_PROVIDER_SITE_OTHER): Payer: MEDICAID | Admitting: Licensed Clinical Social Worker

## 2024-05-06 DIAGNOSIS — F419 Anxiety disorder, unspecified: Secondary | ICD-10-CM

## 2024-05-06 DIAGNOSIS — F32A Depression, unspecified: Secondary | ICD-10-CM

## 2024-05-06 DIAGNOSIS — F3181 Bipolar II disorder: Secondary | ICD-10-CM
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
## 2024-05-29 ENCOUNTER — Ambulatory Visit (INDEPENDENT_AMBULATORY_CARE_PROVIDER_SITE_OTHER): Payer: MEDICAID | Admitting: Licensed Clinical Social Worker

## 2024-05-29 DIAGNOSIS — F3181 Bipolar II disorder: Secondary | ICD-10-CM
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
## 2024-05-30 ENCOUNTER — Ambulatory Visit (HOSPITAL_COMMUNITY): Payer: MEDICAID | Admitting: Physician Assistant

## 2024-05-30 VITALS — BP 120/86 | HR 86 | Temp 97.9°F | Ht 67.32 in | Wt 200.8 lb

## 2024-05-30 DIAGNOSIS — F411 Generalized anxiety disorder: Secondary | ICD-10-CM | POA: Diagnosis not present

## 2024-05-30 DIAGNOSIS — F901 Attention-deficit hyperactivity disorder, predominantly hyperactive type: Secondary | ICD-10-CM

## 2024-05-30 DIAGNOSIS — F3181 Bipolar II disorder: Secondary | ICD-10-CM

## 2024-05-30 MED ORDER — ARIPIPRAZOLE 5 MG PO TABS: 5.0000 mg | ORAL_TABLET | Freq: Every day | ORAL | 1 refills | Status: DC

## 2024-05-30 MED ORDER — HYDROXYZINE HCL 10 MG PO TABS: 10.0000 mg | ORAL_TABLET | Freq: Three times a day (TID) | ORAL | 1 refills | Status: AC | PRN

## 2024-05-30 MED ORDER — ATOMOXETINE HCL 40 MG PO CAPS: 40.0000 mg | ORAL_CAPSULE | Freq: Every day | ORAL | 1 refills | Status: DC
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
## 2024-06-13 ENCOUNTER — Ambulatory Visit: Payer: MEDICAID
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
## 2024-06-23 ENCOUNTER — Encounter (HOSPITAL_COMMUNITY): Payer: Self-pay | Admitting: Physician Assistant

## 2024-06-25 ENCOUNTER — Ambulatory Visit: Payer: MEDICAID | Attending: Physician Assistant

## 2024-07-04 ENCOUNTER — Encounter (HOSPITAL_COMMUNITY): Payer: Self-pay

## 2024-07-04 ENCOUNTER — Ambulatory Visit (HOSPITAL_COMMUNITY): Payer: MEDICAID | Admitting: Licensed Clinical Social Worker

## 2024-07-04 NOTE — Progress Notes (Deleted)
 BH MD/PA/NP OP Progress Note  07/04/2024 2:31 PM CHRISTOP Cruz  MRN:  [id number]  Visit Diagnosis: No diagnosis found.  Assessment:  Patient is a 33 year old male with past psychiatric history of bipolar 2 disorder, GAD, ADHD, tobacco dependence presented to [facility] outpatient clinic as a walk-in for medication refill.  He last saw Zane Mouse virtually for initial evaluation on 06/24/22.  At that time, he was prescribed Abilify  for 5 mg daily which was increased later to 10 g daily, Strattera  40 mg daily, hydroxyzine  10 mg 3 times daily as needed for anxiety add nicotine  patch 21 mg.   Patient has been noncompliant with his medications for last couple of months but now wants to restart his medications.  He has refills for Abilify  but does not have refills for Strattera .  He is requesting refill for Strattera . Encouraged medication compliance.  He is not planning to restart hydroxyzine  as he does not need it.   Risk Assessment: An assessment of suicide and violence risk factors was performed as part of this evaluation and is not significantly changed from the last visit. While future psychiatric events cannot be accurately predicted, the patient does not currently require acute inpatient psychiatric care and does not  currently meet [place]  involuntary commitment criteria. Patient was given contact information for crisis resources, behavioral health clinic and was instructed to call 911 for emergencies.   Cameron Cruz presents for follow-up evaluation. Today, 07/04/24, patient reports ***  1. Bipolar 2 disorder, major depressive episode (HCC)   Restart ARIPiprazole  (ABILIFY ) 10 MG tablet; Take 1 tablet (10 mg total) by mouth daily.  Patient has refills.   2. Generalized anxiety disorder   Stop hydroxyzine . Hasn't been taking hydrOXYzine  (ATARAX ); for anxiety.  He is not planning to restart hydroxyzine .   3. Attention deficit hyperactivity disorder (ADHD), predominantly  hyperactive type   Continue atomoxetine  (STRATTERA ) 40 MG capsule; Take 1 capsule (40 mg total) by mouth daily.  Dispense: 30 capsule; Refill: 1   Follow-up in 2 months  Chief Complaint: No chief complaint on file.  HPI: Patient is a 33 year old male with past psychiatric history of bipolar 2 disorder, GAD, ADHD, tobacco dependence presented to [facility] outpatient clinic as a walk-in for medication refill.  He last saw Zane Mouse virtually for initial evaluation on 06/24/22.  At that time, he was prescribed Abilify  for 5 mg daily which was increased later to 10 g daily, Strattera  40 mg daily, hydroxyzine  10 mg 3 times daily as needed for anxiety add nicotine  patch 21 mg..   Patient states that he needs medication refill for Strattera .  He has not been taking Abilify  and hydroxyzine  but has refills for  these medications.  He last took Abilify  2 months ago.  Discussed the importance of medication compliance for mood stabilization.  He reports that he is planning to start taking his medications regularly.  He reports that these medication helps him with his mood and attention. He denies use of any illicit drugs or alcohol.  He smokes cigarette 1 pack a day.  He reports that he does not want to use nicotine  patch anymore.  He reports that he is living in a shelter which is his biggest stressors.  He does not have any social support.  He gets mad, angry and gets into arguments sometimes.  He reports high energy episodes when he take risks (drives fast), feels happy, and spends money excessively. He denies pressured speech and decreased need for sleep.  He reports that these episodes lasts for couple of hours to max 5 days.  He had been on Abilify , trazodone  and Adderall in the past.  He denies any side effects from medications.  He wants to continue Strattera  and Abilify . Currently, he denies any SI, HI, AVH.  He denies any paranoia.  He is sleeping well and reports stable appetite.  He denies any weight  gain or weight loss.  He denies any previous suicidal attempts or psychiatric hospitalizations.  He reports that he was diagnosed with ADHD 10 years ago and was recently taking Adderall.   Past Psychiatric History: ***  Past Medical History:  Past Medical History:  Diagnosis Date   Anxiety    Attention deficit disorder as a child   Bipolar disorder (HCC)    Depression    No past surgical history on file.  Family Psychiatric History: ***  Family History:  Family History  Family history unknown: Yes    Social History:  Social History   Socioeconomic History   Marital status: Single    Spouse name: Not on file   Number of children: Not on file   Years of education: Not on file   Highest education level: Not on file  Occupational History   Not on file  Tobacco Use   Smoking status: Every Day    Current packs/day: 1.00    Average packs/day: 1 pack/day for 5.0 years (5.0 ttl pk-yrs)    Types: Cigarettes   Smokeless tobacco: Never  Vaping Use   Vaping status: Never Used  Substance and Sexual Activity   Alcohol use: Not Currently    Comment: occasionally   Drug use: Never   Sexual activity: Not Currently  Other Topics Concern   Not on file  Social History Narrative   Not on file   Social Drivers of Health   Financial Resource Strain: High Risk (01/25/2024)   Overall Financial Resource Strain (CARDIA)    Difficulty of Paying Living Expenses: Very hard  Food Insecurity: Food Insecurity Present (01/25/2024)   Hunger Vital Sign    Worried About Running Out of Food in the Last Year: Sometimes true    Ran Out of Food in the Last Year: Sometimes true  Transportation Needs: Unmet Transportation Needs (01/25/2024)   PRAPARE - Transportation    Lack of Transportation (Medical): Yes    Lack of Transportation (Non-Medical): Yes  Physical Activity: Inactive (01/25/2024)   Exercise Vital Sign    Days of Exercise per Week: 0 days    Minutes of Exercise per Session: 0 min   Stress: No Stress Concern Present (01/25/2024)   [employer] of Occupational Health - Occupational Stress Questionnaire    Feeling of Stress : Not at all  Social Connections: Socially Isolated (01/25/2024)   Social Connection and Isolation Panel    Frequency of Communication with Friends and Family: More than three times a week    Frequency of Social Gatherings with Friends and Family: Twice a week    Attends Religious Services: Never    Database administrator or Organizations: No    Attends Engineer, structural: Never    Marital Status: Never married    Allergies: No Known Allergies  Current Medications: Current Outpatient Medications  Medication Sig Dispense Refill   ARIPiprazole  (ABILIFY ) 5 MG tablet Take 1 tablet (5 mg total) by mouth daily. 30 tablet 1   atomoxetine  (STRATTERA ) 40 MG capsule Take 1 capsule (40 mg total) by mouth daily. 30  capsule 1   hydrOXYzine  (ATARAX ) 10 MG tablet Take 1 tablet (10 mg total) by mouth 3 (three) times daily as needed. 75 tablet 1   methocarbamol  (ROBAXIN ) 500 MG tablet Take 1 tablet (500 mg total) by mouth every 8 (eight) hours as needed for muscle spasms. 60 tablet 0   naproxen  (NAPROSYN ) 500 MG tablet Take 1 tablet (500 mg total) by mouth 2 (two) times daily as needed. 60 tablet 0   No current facility-administered medications for this visit.     Musculoskeletal: Strength & Muscle Tone: within normal limits Gait & Station: normal Patient leans: N/A  Psychiatric Specialty Exam: There were no vitals taken for this visit.There is no height or weight on file to calculate BMI. Review of Systems  General Appearance: Casual  Eye Contact:  Good  Speech:  Clear and Coherent and Normal Rate  Volume:  Normal  Mood:  {BHH MOOD:22306}  Affect:  Congruent  Thought Content: Logical   Suicidal Thoughts:  No  Homicidal Thoughts:  No  Thought Process:  Goal Directed  Orientation:  Full (Time, Place, and Person)    Memory:  Immediate;   Good Recent;   Good Remote;   Good  Judgment:  Fair  Insight:  Fair  Concentration:  Concentration: Good and Attention Span: Good  Recall:  not formally assessed   Fund of Knowledge: Good  Language: Good  Psychomotor Activity:  Normal  Akathisia:  No  AIMS (if indicated): not done  Assets:  Communication Skills Desire for Improvement Financial Resources/Insurance Housing Physical Health Resilience  ADL's:  Intact  Cognition: WNL  Sleep:  Fair   Metabolic Disorder Labs: Lab Results  Component Value Date   HGBA1C 5.4 09/08/2021   MPG 108.28 09/08/2021   No results found for: PROLACTIN Lab Results  Component Value Date   CHOL 151 09/08/2021   TRIG 57 09/08/2021   HDL 36 (L) 09/08/2021   CHOLHDL 4.2 09/08/2021   VLDL 11 09/08/2021   LDLCALC 104 (H) 09/08/2021   Lab Results  Component Value Date   TSH 1.198 09/08/2021   TSH 1.585 11/10/2008    Therapeutic Level Labs: No results found for: LITHIUM No results found for: VALPROATE No results found for: CBMZ   Screenings: GAD-7    Flowsheet Row Clinical Support from 05/30/2024 in [facility] Counselor from 05/06/2024 in [facility] Office Visit from 01/25/2024 in [facility] [place] - A Dept Of [place] Counselor from 04/04/2023 in [facility] Counselor from 03/15/2023 in [facility]  Total GAD-7 Score 21 21 13 1 11    PHQ2-9    Flowsheet Row Clinical Support from 05/30/2024 in [facility] Counselor from 05/06/2024 in [facility] Office Visit from 01/25/2024 in [facility] [place] - [facility] Dept Of [place]. [facility] Counselor from 04/04/2023 in [facility] Counselor from 03/15/2023 in [facility]  PHQ-2 Total Score 6 6 6 1 3    PHQ-9 Total Score 24 25 10 1 10    Flowsheet Row Clinical Support from 05/30/2024 in [facility] Counselor from 05/06/2024 in [facility] ED from 02/16/2024 in [facility] Emergency Department at [facility]  C-SSRS RISK CATEGORY No Risk No Risk No Risk    Collaboration of Care: Collaboration of Care: Other ***  Patient/Guardian was  advised Release of Information must be obtained prior to any record release in order to collaborate their care with an outside provider. Patient/Guardian was advised if they have not already done so to contact the registration department to sign all necessary forms in order for us  to release information regarding their care.   Consent: Patient/Guardian gives verbal consent for treatment and assignment of benefits for services provided during this visit. Patient/Guardian expressed understanding and agreed to proceed.    Abelardo Seidner, MD 07/04/2024, 2:31 PM

## 2024-07-05 ENCOUNTER — Telehealth: Payer: MEDICAID | Admitting: Physician Assistant

## 2024-07-05 DIAGNOSIS — G8929 Other chronic pain: Secondary | ICD-10-CM

## 2024-07-05 DIAGNOSIS — M545 Low back pain, unspecified: Secondary | ICD-10-CM | POA: Diagnosis not present

## 2024-07-05 DIAGNOSIS — M549 Dorsalgia, unspecified: Secondary | ICD-10-CM | POA: Diagnosis not present

## 2024-07-05 DIAGNOSIS — M542 Cervicalgia: Secondary | ICD-10-CM | POA: Diagnosis not present

## 2024-07-05 MED ORDER — METHOCARBAMOL 750 MG PO TABS: 750.0000 mg | ORAL_TABLET | Freq: Three times a day (TID) | ORAL | 0 refills | Status: DC | PRN

## 2024-07-05 MED ORDER — NAPROXEN 500 MG PO TABS: 500.0000 mg | ORAL_TABLET | Freq: Two times a day (BID) | ORAL | 0 refills | Status: DC

## 2024-07-05 NOTE — Patient Instructions (Signed)
 Cameron Cruz, thank you for joining Delon CHRISTELLA Dickinson, PA-C for today's virtual visit.  While this provider is not your primary care provider (PCP), if your PCP is located in our provider database this encounter information will be shared with them immediately following your visit.   A [facility] MyChart account gives you access to today's visit and all your visits, tests, and labs performed at [facility]  click here if you don't have a [facility] MyChart account or go to [email or web address]  Consent: (Patient) Cameron Cruz provided verbal consent for this virtual visit at the beginning of the encounter.  Current Medications:  Current Outpatient Medications:    methocarbamol  (ROBAXIN ) 750 MG tablet, Take 1 tablet (750 mg total) by mouth every 8 (eight) hours as needed for muscle spasms., Disp: 30 tablet, Rfl: 0   naproxen  (NAPROSYN ) 500 MG tablet, Take 1 tablet (500 mg total) by mouth 2 (two) times daily with a meal., Disp: 30 tablet, Rfl: 0   ARIPiprazole  (ABILIFY ) 5 MG tablet, Take 1 tablet (5 mg total) by mouth daily., Disp: 30 tablet, Rfl: 1   atomoxetine  (STRATTERA ) 40 MG capsule, Take 1 capsule (40 mg total) by mouth daily., Disp: 30 capsule, Rfl: 1   hydrOXYzine  (ATARAX ) 10 MG tablet, Take 1 tablet (10 mg total) by mouth 3 (three) times daily as needed., Disp: 75 tablet, Rfl: 1   Medications ordered in this encounter:  Meds ordered this encounter  Medications   naproxen  (NAPROSYN ) 500 MG tablet    Sig: Take 1 tablet (500 mg total) by mouth 2 (two) times daily with a meal.    Dispense:  30 tablet    Refill:  0    Supervising Provider:   LAMPTEY, PHILIP O [[id number]]   methocarbamol  (ROBAXIN ) 750 MG tablet    Sig: Take 1 tablet (750 mg total) by mouth every 8 (eight) hours as needed for muscle spasms.    Dispense:  30 tablet    Refill:  0    Supervising Provider:   LAMPTEY, PHILIP O [[id number]]     *If you need refills on other medications prior to  your next appointment, please contact your pharmacy*  Follow-Up: Call back or seek an in-person evaluation if the symptoms worsen or if the condition fails to improve as anticipated.  [facility] Virtual Care [phone]  Other Instructions  [facility] Location: [facility] at [facility] [address] [place], [place] [address] Phone: [phone] Convenient hours: Monday - Friday: 11 a.m. - 7 p.m. Visit our website to schedule an appointment online or walk-in during clinic hours. Your well-being is our priority. Move better with us !   Back Exercises The following exercises strengthen the muscles that help to support the trunk (torso) and back. They also help to keep the lower back flexible. Doing these exercises can help to prevent or lessen existing low back pain. If you have back pain or discomfort, try doing these exercises 2-3 times each day or as told by your health care provider. As your pain improves, do them once each day, but increase the number of times that you repeat the steps for each exercise (do more repetitions). To prevent the recurrence of back pain, continue to do these exercises once each day or as told by your health care provider. Do exercises exactly as told by your health care provider and adjust them as directed. It is normal to feel mild stretching, pulling, tightness,  or discomfort as you do these exercises, but you should stop right away if you feel sudden pain or your pain gets worse. Exercises Single knee to chest Repeat these steps 3-5 times for each leg: Lie on your back on a firm bed or the floor with your legs extended. Bring one knee to your chest. Your other leg should stay extended and in contact with the floor. Hold your knee in place by grabbing your knee or thigh with both hands and hold. Pull on your knee until you feel a gentle stretch in your lower back or buttocks. Hold the  stretch for 10-30 seconds. Slowly release and straighten your leg.  Pelvic tilt Repeat these steps 5-10 times: Lie on your back on a firm bed or the floor with your legs extended. Bend your knees so they are pointing toward the ceiling and your feet are flat on the floor. Tighten your lower abdominal muscles to press your lower back against the floor. This motion will tilt your pelvis so your tailbone points up toward the ceiling instead of pointing to your feet or the floor. With gentle tension and even breathing, hold this position for 5-10 seconds.  Cat-cow Repeat these steps until your lower back becomes more flexible: Get into a hands-and-knees position on a firm bed or the floor. Keep your hands under your shoulders, and keep your knees under your hips. You may place padding under your knees for comfort. Let your head hang down toward your chest. Contract your abdominal muscles and point your tailbone toward the floor so your lower back becomes rounded like the back of a cat. Hold this position for 5 seconds. Slowly lift your head, let your abdominal muscles relax, and point your tailbone up toward the ceiling so your back forms a sagging arch like the back of a cow. Hold this position for 5 seconds.  Press-ups Repeat these steps 5-10 times: Lie on your abdomen (face-down) on a firm bed or the floor. Place your palms near your head, about shoulder-width apart. Keeping your back as relaxed as possible and keeping your hips on the floor, slowly straighten your arms to raise the top half of your body and lift your shoulders. Do not use your back muscles to raise your upper torso. You may adjust the placement of your hands to make yourself more comfortable. Hold this position for 5 seconds while you keep your back relaxed. Slowly return to lying flat on the floor.  Bridges Repeat these steps 10 times: Lie on your back on a firm bed or the floor. Bend your knees so they are pointing  toward the ceiling and your feet are flat on the floor. Your arms should be flat at your sides, next to your body. Tighten your buttocks muscles and lift your buttocks off the floor until your waist is at almost the same height as your knees. You should feel the muscles working in your buttocks and the back of your thighs. If you do not feel these muscles, slide your feet 1-2 inches (2.5-5 cm) farther away from your buttocks. Hold this position for 3-5 seconds. Slowly lower your hips to the starting position, and allow your buttocks muscles to relax completely. If this exercise is too easy, try doing it with your arms crossed over your chest. Abdominal crunches Repeat these steps 5-10 times: Lie on your back on a firm bed or the floor with your legs extended. Bend your knees so they are pointing toward the ceiling  and your feet are flat on the floor. Cross your arms over your chest. Tip your chin slightly toward your chest without bending your neck. Tighten your abdominal muscles and slowly raise your torso high enough to lift your shoulder blades a tiny bit off the floor. Avoid raising your torso higher than that because it can put too much stress on your lower back and does not help to strengthen your abdominal muscles. Slowly return to your starting position.  Back lifts Repeat these steps 5-10 times: Lie on your abdomen (face-down) with your arms at your sides, and rest your forehead on the floor. Tighten the muscles in your legs and your buttocks. Slowly lift your chest off the floor while you keep your hips pressed to the floor. Keep the back of your head in line with the curve in your back. Your eyes should be looking at the floor. Hold this position for 3-5 seconds. Slowly return to your starting position.  Contact a health care provider if: Your back pain or discomfort gets much worse when you do an exercise. Your worsening back pain or discomfort does not lessen within 2 hours after  you exercise. If you have any of these problems, stop doing these exercises right away. Do not do them again unless your health care provider says that you can. Get help right away if: You develop sudden, severe back pain. If this happens, stop doing the exercises right away. Do not do them again unless your health care provider says that you can. This information is not intended to replace advice given to you by your health care provider. Make sure you discuss any questions you have with your health care provider. Document Revised: 12/04/2022 Document Reviewed: 01/13/2021 Elsevier Patient Education  2024 [employer]vier [employer]   If you have been instructed to have an in-person evaluation today at a local Urgent Care facility, please use the link below. It will take you to a list of all of our available [facility], including address, phone number and hours of operation. Please do not delay care.  [facility]  If you or a family member do not have a primary care provider, use the link below to schedule a visit and establish care. When you choose a [facility] primary care physician or advanced practice provider, you gain a long-term partner in health. Find a Primary Care Provider  Learn more about [facility]'s in-office and virtual care options: [place] - Get Care Now
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
## 2024-07-10 ENCOUNTER — Other Ambulatory Visit: Payer: Self-pay

## 2024-07-10 ENCOUNTER — Ambulatory Visit (HOSPITAL_COMMUNITY)
Admission: EM | Admit: 2024-07-10 | Discharge: 2024-07-10 | Disposition: A | Discharge status: Home / Self Care | Payer: MEDICAID | Attending: Family Medicine | Admitting: Family Medicine

## 2024-07-10 ENCOUNTER — Other Ambulatory Visit (HOSPITAL_COMMUNITY): Payer: Self-pay

## 2024-07-10 ENCOUNTER — Encounter (HOSPITAL_COMMUNITY): Payer: Self-pay | Admitting: Emergency Medicine

## 2024-07-10 ENCOUNTER — Ambulatory Visit: Payer: Self-pay

## 2024-07-10 DIAGNOSIS — Z113 Encounter for screening for infections with a predominantly sexual mode of transmission: Secondary | ICD-10-CM | POA: Diagnosis not present

## 2024-07-10 DIAGNOSIS — H6693 Otitis media, unspecified, bilateral: Secondary | ICD-10-CM | POA: Diagnosis not present

## 2024-07-10 DIAGNOSIS — K0889 Other specified disorders of teeth and supporting structures: Secondary | ICD-10-CM | POA: Diagnosis not present

## 2024-07-10 LAB — HIV ANTIBODY (ROUTINE TESTING W REFLEX): HIV Screen 4th Generation wRfx: NONREACTIVE

## 2024-07-10 MED ORDER — AMOXICILLIN 875 MG PO TABS
875.0000 mg | ORAL_TABLET | Freq: Two times a day (BID) | ORAL | 0 refills | Status: AC
  Filled 2024-07-10: qty 14, 7d supply, fill #0

## 2024-07-10 MED ORDER — IBUPROFEN 800 MG PO TABS
800.0000 mg | ORAL_TABLET | Freq: Three times a day (TID) | ORAL | 0 refills | Status: DC
  Filled 2024-07-10 (×2): qty 21, 7d supply, fill #0

## 2024-07-10 NOTE — Discharge Instructions (Addendum)
 We have sent testing for sexually transmitted infections. We will notify you of any positive results once they are received. If required, we will prescribe any medications you might need.  Please refrain from all sexual activity for at least the next seven days.

## 2024-07-10 NOTE — ED Provider Notes (Signed)
 [facility]   [id number] 07/10/24 Arrival Time: 0954  ASSESSMENT & PLAN:  1. Pain, dental   2. Screening for STDs (sexually transmitted diseases)   3. OM (otitis media), recurrent, bilateral    No sign of abscess requiring I&D at this time. Discussed.  Meds ordered this encounter  Medications   amoxicillin  (AMOXIL ) 875 MG tablet    Sig: Take 1 tablet (875 mg total) by mouth 2 (two) times daily for 7 days.    Dispense:  14 tablet    Refill:  0   ibuprofen  (ADVIL ) 800 MG tablet    Sig: Take 1 tablet (800 mg total) by mouth 3 (three) times daily with meals.    Dispense:  21 tablet    Refill:  0   Dental resource written instructions given. He will schedule dental evaluation as soon as possible if not improving over the next 24-48 hours.   Discharge Instructions      We have sent testing for sexually transmitted infections. We will notify you of any positive results once they are received. If required, we will prescribe any medications you might need.  Please refrain from all sexual activity for at least the next seven days.       Reviewed expectations re: course of current medical issues. Questions answered. Outlined signs and symptoms indicating need for more acute intervention. Patient verbalized understanding. After Visit Summary given.   SUBJECTIVE:  Cameron Cruz is a 33 y.o. male who reports gradual onset of L upper tooth/molar pain along with bilateral ear pain without drainage. Few days. H2O2 in R ear without much help.  Also requests STD testing. No symptoms.  OBJECTIVE: Vitals:   07/10/24 1111 07/10/24 1113  BP: (!) 142/103 (!) 139/102  Pulse: 80   Resp: 18   Temp: 97.8 F (36.6 C)   TempSrc: Oral   SpO2: 98%     General appearance: alert; no distress HENT: normocephalic; atraumatic; dentition: good; left upper gum without areas of fluctuance, drainage, or bleeding and with tenderness to palpation; normal jaw movement without  difficulty Neck: supple without LAD; FROM; trachea midline Lungs: normal respirations; unlabored; speaks full sentences without difficulty Skin: warm and dry Psychological: alert and cooperative; normal mood and affect  No Known Allergies  Past Medical History:  Diagnosis Date   Anxiety    Attention deficit disorder as a child   Bipolar disorder (HCC)    Depression    Social History   Socioeconomic History   Marital status: Single    Spouse name: Not on file   Number of children: Not on file   Years of education: Not on file   Highest education level: Not on file  Occupational History   Not on file  Tobacco Use   Smoking status: Every Day    Current packs/day: 1.00    Average packs/day: 1 pack/day for 5.0 years (5.0 ttl pk-yrs)    Types: Cigarettes   Smokeless tobacco: Never  Vaping Use   Vaping status: Never Used  Substance and Sexual Activity   Alcohol use: Not Currently    Comment: occasionally   Drug use: Never   Sexual activity: Not Currently  Other Topics Concern   Not on file  Social History Narrative   Not on file   Social Drivers of Health   Financial Resource Strain: High Risk (01/25/2024)   Overall Financial Resource Strain (CARDIA)    Difficulty of Paying Living Expenses: Very hard  Food Insecurity: Food Insecurity  Present (01/25/2024)   Hunger Vital Sign    Worried About Running Out of Food in the Last Year: Sometimes true    Ran Out of Food in the Last Year: Sometimes true  Transportation Needs: Unmet Transportation Needs (01/25/2024)   PRAPARE - Transportation    Lack of Transportation (Medical): Yes    Lack of Transportation (Non-Medical): Yes  Physical Activity: Inactive (01/25/2024)   Exercise Vital Sign    Days of Exercise per Week: 0 days    Minutes of Exercise per Session: 0 min  Stress: No Stress Concern Present (01/25/2024)   [employer] of Occupational Health - Occupational Stress Questionnaire    Feeling of Stress : Not at all   Social Connections: Socially Isolated (01/25/2024)   Social Connection and Isolation Panel    Frequency of Communication with Friends and Family: More than three times a week    Frequency of Social Gatherings with Friends and Family: Twice a week    Attends Religious Services: Never    Database administrator or Organizations: No    Attends Banker Meetings: Never    Marital Status: Never married  Intimate Partner Violence: Not At Risk (01/25/2024)   Humiliation, Afraid, Rape, and Kick questionnaire    Fear of Current or Ex-Partner: No    Emotionally Abused: No    Physically Abused: No    Sexually Abused: No   Family History  Family history unknown: Yes   History reviewed. No pertinent surgical history.    Rolinda Rogue, MD 07/11/24 [phone]

## 2024-07-10 NOTE — ED Triage Notes (Addendum)
 Dental pain for 2 days on left side of face, if patient pushes on cheek, he feels pain  Right ear pain since yesterday and diminished hearing in both ears per patient.   Patient admits to using peroxide in right ear.    Patient has not taken any medications for symptoms  Patient has requested std testing

## 2024-07-11 ENCOUNTER — Encounter (HOSPITAL_COMMUNITY): Payer: Self-pay

## 2024-07-11 ENCOUNTER — Encounter (HOSPITAL_COMMUNITY): Payer: MEDICAID

## 2024-07-11 ENCOUNTER — Ambulatory Visit (HOSPITAL_COMMUNITY): Payer: MEDICAID | Admitting: Licensed Clinical Social Worker

## 2024-07-11 LAB — RPR: RPR Ser Ql: NONREACTIVE

## 2024-07-11 LAB — CYTOLOGY, (ORAL, ANAL, URETHRAL) ANCILLARY ONLY
Chlamydia: NEGATIVE
Comment: NEGATIVE
Comment: NEGATIVE
Comment: NORMAL
Neisseria Gonorrhea: NEGATIVE
Trichomonas: POSITIVE — AB

## 2024-07-12 ENCOUNTER — Telehealth: Payer: MEDICAID

## 2024-07-12 ENCOUNTER — Telehealth: Payer: MEDICAID | Admitting: Family Medicine

## 2024-07-12 DIAGNOSIS — A599 Trichomoniasis, unspecified: Secondary | ICD-10-CM | POA: Diagnosis not present

## 2024-07-12 MED ORDER — METRONIDAZOLE 500 MG PO TABS: 500.0000 mg | ORAL_TABLET | Freq: Two times a day (BID) | ORAL | 0 refills | Status: AC

## 2024-07-12 NOTE — Patient Instructions (Addendum)
  Cameron Cruz, thank you for joining Cameron [name], FNP for today's virtual visit.  While this provider is not your primary care provider (PCP), if your PCP is located in our provider database this encounter information will be shared with them immediately following your visit.   A [facility] MyChart account gives you access to today's visit and all your visits, tests, and labs performed at [facility]  click here if you don't have a [facility] MyChart account or go to [email or web address]  Consent: (Patient) Cameron Cruz provided verbal consent for this virtual visit at the beginning of the encounter.  Current Medications:  Current Outpatient Medications:    metroNIDAZOLE  (FLAGYL ) 500 MG tablet, Take 1 tablet (500 mg total) by mouth 2 (two) times daily for 7 days., Disp: 14 tablet, Rfl: 0   amoxicillin  (AMOXIL ) 875 MG tablet, Take 1 tablet (875 mg total) by mouth 2 (two) times daily for 7 days., Disp: 14 tablet, Rfl: 0   hydrOXYzine  (ATARAX ) 10 MG tablet, Take 1 tablet (10 mg total) by mouth 3 (three) times daily as needed., Disp: 75 tablet, Rfl: 1   ibuprofen  (ADVIL ) 800 MG tablet, Take 1 tablet (800 mg total) by mouth 3 (three) times daily with meals., Disp: 21 tablet, Rfl: 0   methocarbamol  (ROBAXIN ) 750 MG tablet, Take 1 tablet (750 mg total) by mouth every 8 (eight) hours as needed for muscle spasms., Disp: 30 tablet, Rfl: 0   Medications ordered in this encounter:  Meds ordered this encounter  Medications   metroNIDAZOLE  (FLAGYL ) 500 MG tablet    Sig: Take 1 tablet (500 mg total) by mouth 2 (two) times daily for 7 days.    Dispense:  14 tablet    Refill:  0    Supervising Provider:   BLAISE ALEENE KIDD [[id number]]     *If you need refills on other medications prior to your next appointment, please contact your pharmacy*  Follow-Up: Call back or seek an in-person evaluation if the symptoms worsen or if the condition fails to improve as  anticipated.  [facility] [phone]  Other Instructions Start Metronidazole  today.  Educational handouts provided in AVS for diagnosis and treatment of trichomonas. Recommend that he notifies any sexual partners so that they can undergo testing as well.  If you have been instructed to have an in-person evaluation today at a local Urgent Care facility, please use the link below. It will take you to a list of all of our available [facility], including address, phone number and hours of operation. Please do not delay care.  [facility]  If you or a family member do not have a primary care provider, use the link below to schedule a visit and establish care. When you choose a [facility] primary care physician or advanced practice provider, you gain a long-term partner in health. Find a Primary Care Provider  Learn more about [facility] in-office and virtual care options:  - Get Care Now
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
## 2024-07-24 ENCOUNTER — Other Ambulatory Visit (HOSPITAL_BASED_OUTPATIENT_CLINIC_OR_DEPARTMENT_OTHER): Payer: Self-pay

## 2024-08-01 ENCOUNTER — Ambulatory Visit: Payer: Self-pay

## 2024-08-01 ENCOUNTER — Telehealth: Payer: MEDICAID

## 2024-08-01 ENCOUNTER — Telehealth: Payer: MEDICAID | Admitting: Physician Assistant

## 2024-08-01 DIAGNOSIS — K047 Periapical abscess without sinus: Secondary | ICD-10-CM | POA: Diagnosis not present

## 2024-08-01 MED ORDER — ETODOLAC 200 MG PO CAPS: 200.0000 mg | ORAL_CAPSULE | Freq: Two times a day (BID) | ORAL | 0 refills | Status: DC

## 2024-08-01 MED ORDER — CLINDAMYCIN HCL 300 MG PO CAPS: 300.0000 mg | ORAL_CAPSULE | Freq: Three times a day (TID) | ORAL | 0 refills | Status: DC

## 2024-08-01 NOTE — Patient Instructions (Signed)
  Cameron Cruz, thank you for joining Cameron Velma Lunger, PA-C for today's virtual visit.  While this provider is not your primary care provider (PCP), if your PCP is located in our provider database this encounter information will be shared with them immediately following your visit.   A [facility] MyChart account gives you access to today's visit and all your visits, tests, and labs performed at [facility]  click here if you don't have a [facility] MyChart account or go to [email or web address]  Consent: (Patient) Cameron Cruz provided verbal consent for this virtual visit at the beginning of the encounter.  Current Medications:  Current Outpatient Medications:    hydrOXYzine  (ATARAX ) 10 MG tablet, Take 1 tablet (10 mg total) by mouth 3 (three) times daily as needed., Disp: 75 tablet, Rfl: 1   ibuprofen  (ADVIL ) 800 MG tablet, Take 1 tablet (800 mg total) by mouth 3 (three) times daily with meals., Disp: 21 tablet, Rfl: 0   methocarbamol  (ROBAXIN ) 750 MG tablet, Take 1 tablet (750 mg total) by mouth every 8 (eight) hours as needed for muscle spasms., Disp: 30 tablet, Rfl: 0   Medications ordered in this encounter:  No orders of the defined types were placed in this encounter.    *If you need refills on other medications prior to your next appointment, please contact your pharmacy*  Follow-Up: Call back or seek an in-person evaluation if the symptoms worsen or if the condition fails to improve as anticipated.  [facility] [phone]  Other Instructions Chronic dental pain, worsening with concern for active infection.  Stop Ibuprofen . Will start trial off Lodine  for pain and swelling. Do not take more than as directed. Start the antibiotic --Clindamycin  -- taking as directed.  Follow-up with oral surgeon as scheduled.  If you note any non-resolving, new, or worsening symptoms despite treatment, please seek an in-person evaluation  ASAP.   If you have been instructed to have an in-person evaluation today at a local Urgent Care facility, please use the link below. It will take you to a list of all of our available [facility], including address, phone number and hours of operation. Please do not delay care.  [facility]  If you or a family member do not have a primary care provider, use the link below to schedule a visit and establish care. When you choose a [facility] primary care physician or advanced practice provider, you gain a long-term partner in health. Find a Primary Care Provider  Learn more about [facility] in-office and virtual care options: [facility] - Get Care Now

## 2024-08-01 NOTE — Telephone Encounter (Signed)
 FYI Only or Action Required?: FYI only for provider.  Patient was last seen in primary care on 07/12/2024 by Sophronia Olam LABOR, FNP.  Called Nurse Triage reporting Dental Pain.  Symptoms began several months ago.  Interventions attempted: OTC medications: Ibuprofen  and Ice/heat application.  Symptoms are: gradually worsening.  Triage Disposition: See HCP Within 4 Hours (Or PCP Triage)  Patient/caregiver understands and will follow disposition?: Yes     Copied from CRM [id number]. Topic: Clinical - Red Word Triage >> Aug 01, 2024 12:40 PM Emylou G wrote: [facility] that prompted transfer to Nurse Triage: Tooth is in pain Reason for Disposition  [1] SEVERE pain (e.g., excruciating, unable to eat, unable to do any normal activities) AND [2] not improved 2 hours after pain medicine  Answer Assessment - Initial Assessment Questions This RN spoke with patient's mother and patient regarding symptoms. Taking ibuprofen   and using a cold compress for symptoms with no relief.     1. LOCATION: Which tooth is hurting?  (e.g., right-side/left-side, upper/lower, front/back)     Left side mostly in the front of his mouth and in the back part of his mouth 2. ONSET: When did the toothache start?  (e.g., hours, days)      2 months ago  3. SEVERITY: How bad is the toothache?  (Scale 1-10; mild, moderate or severe)     Severe 8/10 4. SWELLING: Is there any visible swelling of your face?     Unable to see any swelling  5. OTHER SYMPTOMS: Do you have any other symptoms? (e.g., fever)     Back pain and headache  Protocols used: Toothache-A-AH
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
## 2024-08-02 NOTE — Telephone Encounter (Signed)
Noted. Patient has been scheduled.

## 2024-08-07 ENCOUNTER — Telehealth: Payer: Self-pay | Admitting: Critical Care Medicine

## 2024-08-07 NOTE — Telephone Encounter (Signed)
 Pt unconfirmed appt 9/26 (per vr ) lvm

## 2024-08-09 ENCOUNTER — Ambulatory Visit: Payer: MEDICAID | Admitting: Nurse Practitioner

## 2024-08-09 ENCOUNTER — Other Ambulatory Visit (HOSPITAL_COMMUNITY): Payer: Self-pay

## 2024-08-09 ENCOUNTER — Emergency Department (HOSPITAL_COMMUNITY)
Admission: EM | Admit: 2024-08-09 | Discharge: 2024-08-09 | Disposition: A | Discharge status: Home / Self Care | Payer: MEDICAID

## 2024-08-09 ENCOUNTER — Other Ambulatory Visit: Payer: Self-pay

## 2024-08-09 DIAGNOSIS — K0889 Other specified disorders of teeth and supporting structures: Secondary | ICD-10-CM | POA: Diagnosis present

## 2024-08-09 DIAGNOSIS — K029 Dental caries, unspecified: Secondary | ICD-10-CM | POA: Diagnosis not present

## 2024-08-09 MED ORDER — KETOROLAC TROMETHAMINE 15 MG/ML IJ SOLN
15.0000 mg | Freq: Once | INTRAMUSCULAR | Status: AC
Start: 2024-08-09 — End: 2024-08-09
  Administered 2024-08-09: 15 mg via INTRAMUSCULAR
  Filled 2024-08-09: qty 1

## 2024-08-09 MED ORDER — KETOROLAC TROMETHAMINE 10 MG PO TABS: 10.0000 mg | ORAL_TABLET | Freq: Four times a day (QID) | ORAL | 0 refills | Status: DC | PRN

## 2024-08-09 MED ORDER — KETOROLAC TROMETHAMINE 10 MG PO TABS
10.0000 mg | ORAL_TABLET | Freq: Four times a day (QID) | ORAL | 0 refills | Status: DC | PRN
  Filled 2024-08-09: qty 20, 5d supply, fill #0

## 2024-08-09 MED ORDER — AMOXICILLIN-POT CLAVULANATE 875-125 MG PO TABS
1.0000 | ORAL_TABLET | Freq: Two times a day (BID) | ORAL | 0 refills | Status: DC
  Filled 2024-08-09: qty 14, 7d supply, fill #0

## 2024-08-09 MED ORDER — AMOXICILLIN-POT CLAVULANATE 875-125 MG PO TABS: 1.0000 | ORAL_TABLET | Freq: Two times a day (BID) | ORAL | 0 refills | Status: DC

## 2024-08-09 NOTE — ED Triage Notes (Signed)
 Pt. Stated, I need something for my teeth ASAP. I have bad tooth on left top and bottom. It started a couple of months ago and Ive seen people for it and its no better.

## 2024-08-09 NOTE — Discharge Instructions (Addendum)
 Please start the Augmentin  and ketorolac  and stop the medication that you have been on.  If you would like a second opinion or to see a different dentist please call the numbers provided.  Continue to take Tylenol  and use the Orajel.  Sometimes you need to take multiple medications at 1 time to help with the symptoms.  Return to the emergency department for difficulty breathing or swallowing.

## 2024-08-09 NOTE — ED Provider Notes (Signed)
 Cameron Cruz   CSN: [id number] Arrival date & time: 08/09/24  9284     Patient presents with: Dental Pain   Cameron Cruz is a 33 y.o. male.   33 year old male with no reported past medical history presenting to the emergency department today with dental pain.  Patient states he has been having left-sided dental pain has been going on now for over 1 year.  He states that this has continued to get worse.  He states that he did see a dentist and the plan is for him to come back on October 6 for reevaluation.  Patient denies difficulty breathing or swallowing.  He has been taking clindamycin  at home as well as etodolac .  His symptoms were getting worse so he came to the ER for further evaluation.   Dental Pain      Prior to Admission medications   Medication Sig Start Date End Date Taking? Authorizing Provider  amoxicillin -clavulanate (AUGMENTIN ) 875-125 MG tablet Take 1 tablet by mouth every 12 (twelve) hours. 08/09/24   Cameron Prentice JONELLE, MD  hydrOXYzine  (ATARAX ) 10 MG tablet Take 1 tablet (10 mg total) by mouth 3 (three) times daily as needed. 05/30/24   Nwoko, Uchenna E, PA  ketorolac  (TORADOL ) 10 MG tablet Take 1 tablet (10 mg total) by mouth every 6 (six) hours as needed. 08/09/24   Cameron Prentice JONELLE, MD    Allergies: Patient has no known allergies.    Review of Systems  HENT:  Positive for dental problem.   All other systems reviewed and are negative.   Updated Vital Signs BP (!) 133/97 (BP Location: Right Arm)   Pulse 84   Temp 98.4 F (36.9 C)   Resp 19   Ht 5' 7 (1.702 m)   Wt 85.7 kg   SpO2 95%   BMI 29.60 kg/m   Physical Exam Vitals and nursing Cruz reviewed.  Constitutional:      Appearance: Normal appearance.  HENT:     Head: Normocephalic and atraumatic.     Mouth/Throat:     Comments: The patient has poor dentition throughout with multiple dental caries, the patient does have some mild gingival  erythema surrounding the back upper left molars with no palpable abscess noted, there is no significant swelling of the posterior oropharynx or floor of the mouth. Neurological:     Mental Status: He is alert.     (all labs ordered are listed, but only abnormal results are displayed) Labs Reviewed - No data to display  EKG: None  Radiology: No results found.   Procedures   Medications Ordered in the ED  ketorolac  (TORADOL ) 15 MG/ML injection 15 mg (has no administration in time range)                                    Medical Decision Making 33 year old male with no reported past medical history presenting to the emergency department today with dental pain.  The patient does not have any findings on exam concerning for deep space soft tissue infection at this time.  I will switch the patient to Augmentin .  Will give him a shot of Toradol  here and send him home with Toradol  to see if this helps with his symptoms.  He is encouraged to follow-up with his dentist.  I have given him a referral to other dentists in the area if  he wants to seek a second opinion.  He will be discharged with return precautions.  Risk Prescription drug management.        Final diagnoses:  Pain, dental    ED Discharge Orders          Ordered    amoxicillin -clavulanate (AUGMENTIN ) 875-125 MG tablet  Every 12 hours,   Status:  Discontinued        08/09/24 0748    ketorolac  (TORADOL ) 10 MG tablet  Every 6 hours PRN,   Status:  Discontinued        08/09/24 0748    amoxicillin -clavulanate (AUGMENTIN ) 875-125 MG tablet  Every 12 hours        08/09/24 0750    ketorolac  (TORADOL ) 10 MG tablet  Every 6 hours PRN        08/09/24 0750               Cameron Prentice SAUNDERS, MD 08/09/24 [id number]

## 2024-08-09 NOTE — Telephone Encounter (Signed)
 Copied from CRM [id number]. Topic: General - Other >> Aug 09, 2024 10:27 AM Amy B wrote:  Reason for CRM: Patient states he missed his appointment today due to being in the hospital.  He wanted to let [employer] know that he will reschedule when he feels better.

## 2024-08-09 NOTE — ED Notes (Signed)
 Patient discharged to home with self. Patient given written and oral discharge instructions. Patient verbalized understanding. Patient ambulatory to exit with steady gait. Patient breathing without difficulty. Patient denies questions, concerns or  needs at this time.

## 2024-08-09 NOTE — Telephone Encounter (Signed)
 FYI
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
## 2024-08-22 ENCOUNTER — Ambulatory Visit: Payer: MEDICAID | Admitting: Critical Care Medicine

## 2024-08-30 ENCOUNTER — Telehealth (HOSPITAL_COMMUNITY): Payer: Self-pay | Admitting: Licensed Clinical Social Worker

## 2024-08-30 ENCOUNTER — Encounter (HOSPITAL_COMMUNITY): Payer: Self-pay

## 2024-08-30 ENCOUNTER — Ambulatory Visit (HOSPITAL_COMMUNITY): Payer: MEDICAID | Admitting: Licensed Clinical Social Worker

## 2024-08-30 DIAGNOSIS — F3181 Bipolar II disorder: Secondary | ICD-10-CM

## 2024-08-30 NOTE — Telephone Encounter (Signed)
 LCSW sent to links to patient's cell phone with no response for 9am therapy appointment.    LCSW followed up with a phone call for a 9 AM appointment.  LCSW left HIPAA compliant voicemail.    LCSW stayed in session until 915 before disconnecting.  Patient to be marked as a no-show for 9 AM therapy appointment.

## 2024-09-04 ENCOUNTER — Telehealth: Payer: MEDICAID

## 2024-09-12 ENCOUNTER — Other Ambulatory Visit (HOSPITAL_COMMUNITY): Payer: Self-pay

## 2024-09-12 ENCOUNTER — Emergency Department (HOSPITAL_COMMUNITY)
Admission: EM | Admit: 2024-09-12 | Discharge: 2024-09-12 | Disposition: A | Discharge status: Home / Self Care | Payer: MEDICAID | Attending: Emergency Medicine | Admitting: Emergency Medicine

## 2024-09-12 ENCOUNTER — Other Ambulatory Visit: Payer: Self-pay

## 2024-09-12 DIAGNOSIS — K029 Dental caries, unspecified: Secondary | ICD-10-CM | POA: Insufficient documentation

## 2024-09-12 DIAGNOSIS — K0889 Other specified disorders of teeth and supporting structures: Secondary | ICD-10-CM | POA: Diagnosis present

## 2024-09-12 MED ORDER — CLINDAMYCIN HCL 150 MG PO CAPS
450.0000 mg | ORAL_CAPSULE | Freq: Three times a day (TID) | ORAL | 0 refills | Status: AC
  Filled 2024-09-12: qty 63, 7d supply, fill #0

## 2024-09-12 MED ORDER — IBUPROFEN 600 MG PO TABS
600.0000 mg | ORAL_TABLET | Freq: Four times a day (QID) | ORAL | 0 refills | Status: DC | PRN
  Filled 2024-09-12: qty 30, 8d supply, fill #0

## 2024-09-12 MED ORDER — KETOROLAC TROMETHAMINE 15 MG/ML IJ SOLN
15.0000 mg | Freq: Once | INTRAMUSCULAR | Status: AC
  Administered 2024-09-12: 15 mg via INTRAMUSCULAR
  Filled 2024-09-12: qty 1

## 2024-09-12 MED ORDER — CLINDAMYCIN HCL 150 MG PO CAPS
450.0000 mg | ORAL_CAPSULE | Freq: Once | ORAL | Status: AC
  Administered 2024-09-12: 450 mg via ORAL
  Filled 2024-09-12: qty 3

## 2024-09-12 MED ORDER — ACETAMINOPHEN 500 MG PO TABS
1000.0000 mg | ORAL_TABLET | Freq: Once | ORAL | Status: AC
  Administered 2024-09-12: 1000 mg via ORAL
  Filled 2024-09-12: qty 2

## 2024-09-12 MED ORDER — LIDOCAINE VISCOUS HCL 2 % MT SOLN
15.0000 mL | Freq: Once | OROMUCOSAL | Status: AC
  Administered 2024-09-12: 15 mL via OROMUCOSAL
  Filled 2024-09-12: qty 15

## 2024-09-12 NOTE — ED Triage Notes (Signed)
 Pt. Stated, I had 2 teeth pulled 2 weeks ago .It was left upper and bottom teeth.  Ive been in pain since then. The last time I was here yall gave me something that did not work. Pt did not call the dentist who pulled my teeth.

## 2024-09-12 NOTE — Discharge Instructions (Addendum)
 You were seen in the ER today for evaluation of your dental pain. Please make sure that you call your dentist to schedule an appointment as you likely need to have additional extractions.  For pain, recommend taking at 1000 mg of Tylenol  and/or 600 mg of ibuprofen  every 6 hours as needed for pain.  I have prescribed you an antibiotic that you will take 3 times a day.  Please complete the entirety of the course.  Again, please make sure you call your dentist to follow-up.  I have included some additional information into this is a report for you to review.  If you have any concerns, new or worsening symptoms, please return to your nearest emergency department for evaluation.  Contact a dental care provider if: You have dental pain and you don't know why. Your pain isn't controlled with medicines. Your symptoms get worse. You have new symptoms. Get help right away if: You can't open your mouth. You're having trouble breathing or swallowing. You have a fever. Your face, neck, or jaw is swollen. These symptoms may be an emergency. Call 911 right away. Do not wait to see if the symptoms will go away. Do not drive yourself to the hospital.

## 2024-09-12 NOTE — ED Provider Notes (Signed)
 [place] EMERGENCY DEPARTMENT AT [facility] Provider Note   CSN: [id number] Arrival date & time: 09/12/24  9044     Patient presents with: Dental Pain   Cameron Cruz is a 33 y.o. male self reportedly otherwise healthy presents to the ER today for evaluation left sided dental pain for 2 weeks. The patient reports that he had some dental extractions done over two weeks ago as well. He has not follow up with his dentist for this pain.  Denies any fevers.  Denies any difficulty eating or drinking.  Denies any trouble swallowing or difficulty breathing.  Denies any fevers.  Has not trialed any medications.   Dental Pain Associated symptoms: no congestion, no facial swelling, no fever and no neck pain        Prior to Admission medications   Medication Sig Start Date End Date Taking? Authorizing Provider  clindamycin  (CLEOCIN ) 150 MG capsule Take 3 capsules (450 mg total) by mouth 3 (three) times daily for 7 days. 09/12/24 09/19/24 Yes Bernis Ernst, PA-C  ibuprofen  (ADVIL ) 600 MG tablet Take 1 tablet (600 mg total) by mouth every 6 (six) hours as needed. 09/12/24  Yes Bernis Ernst, PA-C  hydrOXYzine  (ATARAX ) 10 MG tablet Take 1 tablet (10 mg total) by mouth 3 (three) times daily as needed. 05/30/24   Nwoko, Uchenna E, PA    Allergies: Patient has no known allergies.    Review of Systems  Constitutional:  Negative for chills and fever.  HENT:  Positive for dental problem. Negative for congestion, facial swelling, rhinorrhea and trouble swallowing.   Respiratory:  Negative for shortness of breath.   Cardiovascular:  Negative for chest pain.  Musculoskeletal:  Negative for neck pain and neck stiffness.    Updated Vital Signs BP (!) 152/120 (BP Location: Right Arm)   Pulse 95   Temp (!) 97.3 F (36.3 C) (Oral)   Resp 18   Ht 5' 7 (1.702 m)   Wt 87.1 kg   SpO2 97%   BMI 30.07 kg/m   Physical Exam Vitals and nursing note reviewed.  Constitutional:      General:  He is not in acute distress.    Appearance: He is not toxic-appearing.  HENT:     Head:     Comments: No facial swelling    Mouth/Throat:     Mouth: Mucous membranes are moist.     Comments: Missing teeth.  Dental caries present throughout.  Patient does have some decay present to the upper molars on the upper left.  No surrounding induration, fluctuance, or drainable abscess.  No trismus.  Controlling secretions.  Answering questions appropriately with appropriate speech.  Normal phonation.  No sublingual elevation. Eyes:     General: No scleral icterus. Neck:     Comments: No neck swelling Pulmonary:     Effort: Pulmonary effort is normal.     Breath sounds: Normal breath sounds.  Skin:    General: Skin is warm and dry.  Neurological:     Mental Status: He is alert.     (all labs ordered are listed, but only abnormal results are displayed) Labs Reviewed - No data to display  EKG: None  Radiology: No results found.  Procedures   Medications Ordered in the ED  ketorolac  (TORADOL ) 15 MG/ML injection 15 mg (has no administration in time range)  acetaminophen  (TYLENOL ) tablet 1,000 mg (has no administration in time range)  lidocaine  (XYLOCAINE ) 2 % viscous mouth solution 15 mL (has  no administration in time range)  clindamycin  (CLEOCIN ) capsule 450 mg (has no administration in time range)   Medical Decision Making Risk OTC drugs. Prescription drug management.   33 y.o. male presents to the ER today for evaluation of dental pain. Differential diagnosis includes but is not limited to dental pain, dental caries, abscess, ludwig's angina. Vital signs elevated BP mildly at 137/91, temp 97.39F. Physical exam as noted above.   Patient does not have any emergent signs or symptoms.  He is not having trouble swallowing or breathing.  No stridor.  No trismus.  Controlling secretions.  No similar elevation, doubt any Ludwick's or any deep space infection.  He has multiple dental caries  throughout and is likely experiencing pain from this.  There is no drainable abscess.  Will place patient on clindamycin  as that seems to work for him in the past.  I given him some Toradol , Tylenol , viscous lidocaine  here as well as versus clindamycin .  Will also prescribe him some ibuprofen  6 or milligrams.  Discussed need to follow-up with a dentist.  Patient is agreeable to plan.  Stable for discharge home.  We discussed plan at bedside. We discussed strict return precautions and red flag symptoms. The patient verbalized their understanding and agrees to the plan. The patient is stable and being discharged home in good condition.  Portions of this report may have been transcribed using voice recognition software. Every effort was made to ensure accuracy; however, inadvertent computerized transcription errors may be present.    Final diagnoses:  Pain due to dental caries    ED Discharge Orders          Ordered    clindamycin  (CLEOCIN ) 150 MG capsule  3 times daily        09/12/24 1149    ibuprofen  (ADVIL ) 600 MG tablet  Every 6 hours PRN        09/12/24 1149               Bernis Ernst, PA-C 09/12/24 1605    Emil Share, DO 09/16/24 1500

## 2024-09-13 ENCOUNTER — Emergency Department (HOSPITAL_COMMUNITY)
Admission: EM | Admit: 2024-09-13 | Discharge: 2024-09-13 | Disposition: A | Discharge status: Home / Self Care | Payer: MEDICAID

## 2024-09-13 ENCOUNTER — Other Ambulatory Visit: Payer: Self-pay

## 2024-09-13 ENCOUNTER — Encounter (HOSPITAL_COMMUNITY): Payer: Self-pay

## 2024-09-13 DIAGNOSIS — K0889 Other specified disorders of teeth and supporting structures: Secondary | ICD-10-CM | POA: Insufficient documentation

## 2024-09-13 MED ORDER — KETOROLAC TROMETHAMINE 15 MG/ML IJ SOLN
15.0000 mg | Freq: Once | INTRAMUSCULAR | Status: AC
  Administered 2024-09-13: 15 mg via INTRAMUSCULAR
  Filled 2024-09-13: qty 1

## 2024-09-13 MED ORDER — KETOROLAC TROMETHAMINE 10 MG PO TABS: 10.0000 mg | ORAL_TABLET | Freq: Four times a day (QID) | ORAL | 0 refills | Status: DC | PRN

## 2024-09-13 NOTE — ED Notes (Signed)
 Patient verbalizes understanding of discharge instructions. Opportunity for questioning and answers were provided. Pt discharged from ED.

## 2024-09-13 NOTE — ED Triage Notes (Signed)
 Pt bib ems from dentist office; c/o tooth and gum pain x 2 weeks; 8-9 teeth removed 3 weeks ago; seen for same yesterday, states meds given yesterday are not working; VSS

## 2024-09-13 NOTE — ED Provider Notes (Signed)
 [name] EMERGENCY DEPARTMENT AT [facility] Provider Note   CSN: [id number] Arrival date & time: 09/13/24  1555     Patient presents with: No chief complaint on file.   Cameron Cruz is a 33 y.o. male.   33 year old male with past medical history of poor dentition presenting to the emergency department today with dental pain.  The patient states that he did have multiple teeth pulled last week.  He states he has been having dental pain since then.  Was seen in the emergency department yesterday and started on clindamycin .  He states that he has been taking the ibuprofen  at home with minimal improvement.  He states the pain is a lot worse.  He came into the emergency department today for further evaluation regarding this.  He apparently went to the dentist office and was unable to be seen so he came to the ER for further evaluation.  He reports that the IM medication he received yesterday did help.  Reports that he has been taking the clindamycin  but only had 2 doses of this so far.        Prior to Admission medications   Medication Sig Start Date End Date Taking? Authorizing Provider  ketorolac  (TORADOL ) 10 MG tablet Take 1 tablet (10 mg total) by mouth every 6 (six) hours as needed. 09/13/24  Yes Ula Prentice JONELLE, MD  clindamycin  (CLEOCIN ) 150 MG capsule Take 3 capsules (450 mg total) by mouth 3 (three) times daily for 7 days. 09/12/24 09/19/24  Bernis Ernst, PA-C  hydrOXYzine  (ATARAX ) 10 MG tablet Take 1 tablet (10 mg total) by mouth 3 (three) times daily as needed. 05/30/24   Nwoko, Uchenna E, PA    Allergies: Patient has no known allergies.    Review of Systems  HENT:  Positive for dental problem.   All other systems reviewed and are negative.   Updated Vital Signs BP 134/88   Pulse 62   Temp 98.2 F (36.8 C)   Resp 18   SpO2 96%   Physical Exam Vitals and nursing note reviewed.   Gen: NAD Eyes: PERRL, EOMI HEENT: Poor dentition throughout with evidence  of multiple recent dental extractions, there is no significant swelling noted, no significant swelling of the posterior oropharynx or floor of the mouth, no significant swelling of the anterior neck noted, no trismus Neck: trachea midline Resp: clear to auscultation bilaterally Card: RRR, no murmurs, rubs, or gallops Abd: nontender, nondistended Extremities: no calf tenderness, no edema Vascular: 2+ radial pulses bilaterally, 2+ DP pulses bilaterally Skin: no rashes Psyc: acting appropriately   (all labs ordered are listed, but only abnormal results are displayed) Labs Reviewed - No data to display  EKG: None  Radiology: No results found.   Procedures   Medications Ordered in the ED  ketorolac  (TORADOL ) 15 MG/ML injection 15 mg (has no administration in time range)                                    Medical Decision Making 33 year old male with past medical history of poor dentition presenting to the emergency department today with dental pain.  The patient's exam here is reassuring.  He does not have any evidence of deep space soft tissue infection here on exam.  He is mainly here for pain.  He reports the Toradol  helped yesterday.  Will give him a dose of this here and provide  him some oral Toradol  to take at home until he is able to have a few more days of the oral antibiotics.  Do think he is safe for discharge.  Do not think that imaging is indicated at this time.  He is discharged with return precautions.  Risk Prescription drug management.        Final diagnoses:  Pain, dental    ED Discharge Orders          Ordered    ketorolac  (TORADOL ) 10 MG tablet  Every 6 hours PRN        09/13/24 1629               Ula Prentice SAUNDERS, MD 09/13/24 1630

## 2024-09-13 NOTE — ED Notes (Signed)
 Pt gives verbal consent for mse

## 2024-09-13 NOTE — Discharge Instructions (Signed)
 Please continue the antibiotics.  Sometimes it does take a few days of taking these for them to have full effect.  Take the oral Toradol  which is the same medication you got the shot up today.  He may also take Tylenol  1000 mg every 6 hours over the next few days.  Please follow-up with your dentist and return to the ER for worsening symptoms.

## 2024-09-16 ENCOUNTER — Encounter: Payer: Self-pay | Admitting: Radiology

## 2024-09-26 ENCOUNTER — Other Ambulatory Visit (HOSPITAL_COMMUNITY): Payer: Self-pay

## 2024-09-26 ENCOUNTER — Other Ambulatory Visit: Payer: Self-pay

## 2024-09-26 ENCOUNTER — Ambulatory Visit
Admission: EM | Admit: 2024-09-26 | Discharge: 2024-09-26 | Disposition: A | Discharge status: Home / Self Care | Payer: MEDICAID | Attending: Family Medicine | Admitting: Family Medicine

## 2024-09-26 DIAGNOSIS — R109 Unspecified abdominal pain: Secondary | ICD-10-CM

## 2024-09-26 DIAGNOSIS — R110 Nausea: Secondary | ICD-10-CM | POA: Diagnosis not present

## 2024-09-26 MED ORDER — FAMOTIDINE 20 MG PO TABS
20.0000 mg | ORAL_TABLET | Freq: Two times a day (BID) | ORAL | 0 refills | Status: DC
  Filled 2024-09-26: qty 30, 15d supply, fill #0

## 2024-09-26 MED ORDER — ONDANSETRON 8 MG PO TBDP
8.0000 mg | ORAL_TABLET | Freq: Three times a day (TID) | ORAL | 0 refills | Status: DC | PRN
  Filled 2024-09-26: qty 20, 7d supply, fill #0

## 2024-09-26 NOTE — ED Triage Notes (Addendum)
 Pt c/o abdominal pain and nausea, no vomiting or diarrhea. Pt stated that symptoms started this morning. No medication taken for symptoms. Pt is requesting a work note.

## 2024-09-26 NOTE — ED Provider Notes (Signed)
 [facility]  Note:  This document was prepared using Conservation officer, historic buildings and may include unintentional dictation errors.  MRN: [id number] DOB: [birth year]  Subjective:   Cameron Cruz is a 33 y.o. male presenting for acute onset this morning of upper abdominal pain, nausea without vomiting.  Patient is currently taking clindamycin  and has been for about 8 days for dental infection.  No fever, bloody stools, hospitalizations or long distance travel.  Has not eaten raw foods, drank unfiltered water.  No history of GI disorders including Crohn's, IBS, ulcerative colitis.  No marijuana use.  No alcohol use.  Needs a note to miss work.  No current facility-administered medications for this encounter.  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 No pertinent surgical history.  Family History  Family history unknown: Yes    Social History   Tobacco Use   Smoking status: Every Day    Current packs/day: 1.00    Average packs/day: 1 pack/day for 5.0 years (5.0 ttl pk-yrs)    Types: Cigarettes   Smokeless tobacco: Never  Vaping Use   Vaping status: Never Used  Substance Use Topics   Alcohol use: Not Currently    Comment: occasionally   Drug use: Never    ROS   Objective:   Vitals: BP 116/71 (BP Location: Right Arm)   Pulse 87   Temp 97.8 F (36.6 C) (Oral)   Resp 18   Ht 5' 7 (1.702 m)   Wt 190 lb (86.2 kg)   SpO2 94%   BMI 29.76 kg/m   Physical Exam Constitutional:      General: He is not in acute  distress.    Appearance: Normal appearance. He is well-developed and normal weight. He is not ill-appearing, toxic-appearing or diaphoretic.  HENT:     Head: Normocephalic and atraumatic.     Right Ear: External ear normal.     Left Ear: External ear normal.     Nose: Nose normal.     Mouth/Throat:     Mouth: Mucous membranes are moist.  Eyes:     General: No scleral icterus.       Right eye: No discharge.        Left eye: No discharge.     Extraocular Movements: Extraocular movements intact.     Conjunctiva/sclera: Conjunctivae normal.  Cardiovascular:     Rate and Rhythm: Normal rate and regular rhythm.     Heart sounds: Normal heart sounds. No murmur heard.    No friction rub. No gallop.  Pulmonary:     Effort: Pulmonary effort is normal. No respiratory distress.     Breath sounds: Normal breath sounds. No stridor. No wheezing, rhonchi or rales.  Abdominal:     General: Bowel sounds are increased. There is no distension.     Palpations: Abdomen is soft. There is no mass.     Tenderness: There is generalized abdominal tenderness and tenderness in the epigastric area  and periumbilical area. There is no right CVA tenderness, left CVA tenderness, guarding or rebound.  Skin:    General: Skin is warm and dry.  Neurological:     Mental Status: He is alert and oriented to person, place, and time.  Psychiatric:        Mood and Affect: Mood normal.        Behavior: Behavior normal.        Thought Content: Thought content normal.     Assessment and Plan :   PDMP not reviewed this encounter.  1. Abdominal pain, unspecified abdominal location   2. Nausea without vomiting    Will defer GI testing.  Patient is concerned that this is related to acid reflux which is possible given his dietary choices.  He also may be experiencing the side effect of taking clindamycin .  Recommended light meals, supportive care.  No signs of an acute abdomen.  Counseled patient on potential for adverse  effects with medications prescribed/recommended today, ER and return-to-clinic precautions discussed, patient verbalized understanding.    Christopher Savannah, PA-C 09/26/24 1051

## 2024-09-27 ENCOUNTER — Ambulatory Visit (HOSPITAL_COMMUNITY): Payer: MEDICAID | Admitting: Licensed Clinical Social Worker

## 2024-09-27 DIAGNOSIS — F3181 Bipolar II disorder: Secondary | ICD-10-CM

## 2024-09-27 DIAGNOSIS — F411 Generalized anxiety disorder: Secondary | ICD-10-CM | POA: Diagnosis not present

## 2024-09-27 DIAGNOSIS — F331 Major depressive disorder, recurrent, moderate: Secondary | ICD-10-CM

## 2024-09-27 NOTE — Progress Notes (Signed)
 THERAPIST PROGRESS NOTE  Virtual Visit via Video Note  I connected with Cameron Cruz on 09/27/24 at 10:00 AM EST by a video enabled telemedicine application and verified that I am speaking with the correct person using two identifiers.  Location: Patient: [facility]  Provider: Providers Home    I discussed the limitations of evaluation and management by telemedicine and the availability of in person appointments. The patient expressed understanding and agreed to proceed.   I discussed the assessment and treatment plan with the patient. The patient was provided an opportunity to ask questions and all were answered. The patient agreed with the plan and demonstrated an understanding of the instructions.   The patient was advised to call back or seek an in-person evaluation if the symptoms worsen or if the condition fails to improve as anticipated.  I provided 30 minutes of non-face-to-face time during this encounter.   Cameron GORMAN Patee, LCSW   Participation Level: Active  Behavioral Response: CasualAlertAnxious and Depressed  Type of Therapy: Individual Therapy  Treatment Goals addressed:  Active     Depression CCP Problem  1 MDD       Decrease PHQ-9 below 10 (Completed/Met)     Start:  06/20/22    Expected End:  08/18/23    Resolved:  04/04/23      Walk three times weekly  (Completed/Met)     Start:  06/20/22    Expected End:  08/18/23    Resolved:  05/25/23    Goal Note     Pt reports he is walking daily to work.          identify 3 trigger of depression  (Completed/Met)     Start:  06/20/22    Expected End:  08/18/23    Resolved:  05/25/23    Goal Note     Financials  Anger  Family conflict          STG: Cameron Cruz (Completed/Met)     Start:  06/20/22    Expected End:  08/18/23    Resolved:  05/25/23    Goal Note     Per chart review          General  experience of comfort will improve (Progressing)     Start:  05/29/24    Expected End:  11/15/24            STG: Reduce overall depression score by a minimum of 25% on the Patient Health Questionnaire (PHQ-9) or the Montgomery-Asberg Depression Rating Scale (MADRS)     Start:  05/29/24    Expected End:  11/15/24            WORK WITH Cameron Cruz TO TRACK SYMPTOMS, [name], DIARY CARD, [name] (Completed)     Start:  06/20/22    End:  03/15/23      Administer the PHQ-9 or MADRS weekly for 4 weeks (Completed)     Start:  06/20/22    End:  03/15/23      PROVIDE Cameron Cruz WITH EDUCATIONAL INFORMATION AND READING MATERIAL ON DISSOCIATION, ITS CAUSES, AND SYMPTOMS (Completed)     Start:  06/20/22    End:  05/25/23      WORK WITH Cameron Cruz TO IDENTIFY THE MAJOR COMPONENTS OF A RECENT EPISODE OF DEPRESSION: PHYSICAL SYMPTOMS, MAJOR THOUGHTS AND IMAGES, AND MAJOR BEHAVIORS THEY EXPERIENCED (Completed)  Start:  06/20/22    End:  05/25/23        OP Depression     LTG: Reduce frequency, intensity, and duration of depression symptoms so that daily functioning is improved (Progressing)     Start:  05/29/24    Expected End:  11/15/24         LTG: Increase coping skills to manage depression and improve ability to perform daily activities (Progressing)     Start:  05/29/24    Expected End:  11/15/24          Resolved     Anxiety Disorder CCP Problem  1 GAD     LTG: Patient will score less than 5 on the Generalized Anxiety Disorder 7 Scale (GAD-7) (Completed/Met)     Start:  06/20/22    Expected End:  08/18/23    Resolved:  04/04/23      STG: Patient will complete at least 80% of assigned homework (Completed/Met)     Start:  06/20/22    Expected End:  08/18/23    Resolved:  05/25/23    Goal Note     Per self reporting          Discuss risks and benefits of medication treatment options for this problem and prescribe as  indicated (Completed)     Start:  06/20/22    End:  03/15/23      Encourage patient to take psychotropic medication as prescribed (Completed)     Start:  06/20/22    End:  03/15/23         ProgressTowards Goals: Not Progressing  Interventions: Motivational Interviewing and Supportive    Suicidal/Homicidal: Nowithout intent/plan  Therapist Response:   S (Subjective): Cameron Cruz was alert and oriented 5. He was pleasant, cooperative, maintained good eye contact, and presented with an anxious mood and appropriate affect.  The patient reports that his primary stressor is medication management. He states that he was seen in July but was unable to pick up his medication due to insufficient funds. 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 Cameron Cruz states that maintaining this job is important to him and recognizes that consistent medication management is essential for keeping his stress level down and irritability low.  Cameron Cruz reports that his long-term goal is to achieve independent living, as he currently spends most of his time at his mother's home and stays in a hotel a few times per week. He expresses frustration about wanting to move out of his mother's home but acknowledges that he needs to take steps to ensure he can maintain stability before doing so.  The patient has a history of entering the action phase of the stages of change but not maintaining progress, as evidenced by not picking up his medications, missing behavioral health appointments, and not engaging consistently in therapeutic services.  LCSW informed the patient that if he did not pick up his previous prescriptions, the pharmacy may still have them available, but he must call to verify. Regardless, he will need to schedule a follow-up medication management appointment, as he missed his last one. LCSW emphasized the importance of attending and  maintaining medication management appointments to stay established and avoid lapses in medication refills.  O (Objective): Patient alert and oriented 5, cooperative, good eye contact, anxious mood with appropriate affect. No evidence of psychosis. Insight appears limited but present. Judgment fair.  A (Assessment): Patient experiencing anxiety related  to medication access, financial constraints, housing instability, and desire for greater independence. Demonstrates patterns of inconsistent follow-through with medication management and behavioral health appointments. Motivation for change is present, and patient is showing increased stability with employment and transportation.  P (Plan / Interventions):  LCSW reviewed coping strategies, including meditation and deep breathing, to support emotional regulation, especially during periods when he is off medication.  LCSW advised patient on the process for obtaining medication refills and the importance of contacting the pharmacy.  Patient verbally agreed to call the pharmacy to inquire about refills.  Patient verbally agreed to schedule a medication management appointment.  LCSW utilized psychodynamic therapy to support expression of thoughts, feelings, and concerns in a nonjudgmental environment.  LCSW provided supportive therapy, including praise and encouragement.     Plan: Return again in 3 weeks.  Diagnosis: Bipolar 2 disorder, major depressive episode (HCC)  Major depressive disorder, recurrent episode, moderate (HCC)  Generalized anxiety disorder  Collaboration of Care: Other patient to call for follow-up session with [facility] for medication management to reestablish care.  Patient/Guardian was advised Release of Information must be obtained prior to any record release in order to collaborate their care with an outside provider. Patient/Guardian was advised if they have not already done so to  contact the registration department to sign all necessary forms in order for us  to release information regarding their care.   Consent: Patient/Guardian gives verbal consent for treatment and assignment of benefits for services provided during this visit. Patient/Guardian expressed understanding and agreed to proceed.   Cameron GORMAN Patee, LCSW 09/27/2024

## 2024-10-08 ENCOUNTER — Other Ambulatory Visit (HOSPITAL_COMMUNITY): Payer: Self-pay

## 2024-10-17 ENCOUNTER — Encounter (HOSPITAL_COMMUNITY): Payer: Self-pay

## 2024-10-17 ENCOUNTER — Ambulatory Visit (HOSPITAL_COMMUNITY): Payer: MEDICAID | Admitting: Licensed Clinical Social Worker

## 2024-10-17 NOTE — Telephone Encounter (Signed)
 LCSW called pt for 0800 appointment after 2 text link sent with no response for virtual session. LCSW Left HIPAA compliant VM for pt at 0813. LCSW waited until 0815 before disconnecting.

## 2024-10-23 ENCOUNTER — Other Ambulatory Visit (HOSPITAL_COMMUNITY): Payer: Self-pay

## 2024-10-23 ENCOUNTER — Telehealth: Payer: MEDICAID | Admitting: Family Medicine

## 2024-10-23 ENCOUNTER — Other Ambulatory Visit: Payer: Self-pay

## 2024-10-23 ENCOUNTER — Emergency Department (HOSPITAL_COMMUNITY)
Admission: EM | Admit: 2024-10-23 | Discharge: 2024-10-23 | Disposition: A | Discharge status: Home / Self Care | Payer: MEDICAID

## 2024-10-23 DIAGNOSIS — K0889 Other specified disorders of teeth and supporting structures: Secondary | ICD-10-CM | POA: Insufficient documentation

## 2024-10-23 LAB — URINALYSIS, ROUTINE W REFLEX MICROSCOPIC
Bilirubin Urine: NEGATIVE
Glucose, UA: NEGATIVE mg/dL
Hgb urine dipstick: NEGATIVE
Ketones, ur: NEGATIVE mg/dL
Leukocytes,Ua: NEGATIVE
Nitrite: NEGATIVE
Protein, ur: NEGATIVE mg/dL
Specific Gravity, Urine: 1.01 (ref 1.005–1.030)
pH: 6 (ref 5.0–8.0)

## 2024-10-23 MED ORDER — KETOROLAC TROMETHAMINE 15 MG/ML IJ SOLN
15.0000 mg | Freq: Once | INTRAMUSCULAR | Status: AC
  Administered 2024-10-23: 15 mg via INTRAMUSCULAR

## 2024-10-23 MED ORDER — KETOROLAC TROMETHAMINE 15 MG/ML IJ SOLN
15.0000 mg | Freq: Once | INTRAMUSCULAR | Status: DC
  Filled 2024-10-23: qty 1

## 2024-10-23 MED ORDER — AMOXICILLIN-POT CLAVULANATE 875-125 MG PO TABS
1.0000 | ORAL_TABLET | Freq: Two times a day (BID) | ORAL | 0 refills | Status: AC
  Filled 2024-10-23: qty 14, 7d supply, fill #0

## 2024-10-23 NOTE — ED Provider Notes (Signed)
 [place] EMERGENCY DEPARTMENT AT [facility] Provider Note   CSN: [id number] Arrival date & time: 10/23/24  0140     Patient presents with: Dental Pain   Cameron Cruz is a 33 y.o. male.    Dental Pain Associated symptoms: no fever    Presents because of dental pain.  Present over the past day or 2.  Has had dental tractions in the past because of dental caries.  Patient states hurts left side of mouth and radiates up into his upper jaw.  No swelling.  No difficulty chewing.  No fever no chills.  No neck swelling or pain.  No headache.  No vision changes.  Also asking to be tested for STDs.  No recent sexual contacts.  No penile discharge.  No genital ulcers or bumps that he can appreciate.  Previous medical history reviewed : Last seen in the ED on September 26, 2024.  Was seen because of upper abdominal pain.  Negative workup at that time.  Presented to ED on September 13, 2024 because of dental pain as well     Prior to Admission medications   Medication Sig Start Date End Date Taking? Authorizing Provider  amoxicillin -clavulanate (AUGMENTIN ) 875-125 MG tablet Take 1 tablet by mouth every 12 (twelve) hours for 7 days. 10/23/24 10/30/24 Yes Simon Lavonia SAILOR, MD  famotidine  (PEPCID ) 20 MG tablet Take 1 tablet (20 mg total) by mouth 2 (two) times daily. 09/26/24   Christopher Savannah, PA-C  hydrOXYzine  (ATARAX ) 10 MG tablet Take 1 tablet (10 mg total) by mouth 3 (three) times daily as needed. 05/30/24   Nwoko, Uchenna E, PA  ketorolac  (TORADOL ) 10 MG tablet Take 1 tablet (10 mg total) by mouth every 6 (six) hours as needed. 09/13/24   Ula Prentice JONELLE, MD  ondansetron  (ZOFRAN -ODT) 8 MG disintegrating tablet Take 1 tablet (8 mg total) by dissolving in the mouth every 8 (eight) hours as needed for nausea or vomiting. 09/26/24   Christopher Savannah, PA-C    Allergies: Patient has no known allergies.    Review of Systems  Constitutional:  Negative for chills and fever.  HENT:  Negative for ear  pain and sore throat.   Eyes:  Negative for pain and visual disturbance.  Respiratory:  Negative for cough and shortness of breath.   Cardiovascular:  Negative for chest pain and palpitations.  Gastrointestinal:  Negative for abdominal pain and vomiting.  Genitourinary:  Negative for dysuria and hematuria.  Musculoskeletal:  Negative for arthralgias and back pain.  Skin:  Negative for color change and rash.  Neurological:  Negative for seizures and syncope.  All other systems reviewed and are negative.   Updated Vital Signs BP 125/89 (BP Location: Right Arm)   Pulse 89   Temp 98.4 F (36.9 C)   Resp 17   Ht 5' 7 (1.702 m)   Wt 86.2 kg   SpO2 96%   BMI 29.76 kg/m   Physical Exam Vitals and nursing note reviewed.  Constitutional:      General: He is not in acute distress.    Appearance: He is well-developed.  HENT:     Head: Normocephalic and atraumatic.     Mouth/Throat:   Eyes:     Conjunctiva/sclera: Conjunctivae normal.  Cardiovascular:     Rate and Rhythm: Normal rate and regular rhythm.     Heart sounds: No murmur heard. Pulmonary:     Effort: Pulmonary effort is normal. No respiratory distress.  Breath sounds: Normal breath sounds.  Abdominal:     Palpations: Abdomen is soft.     Tenderness: There is no abdominal tenderness.  Musculoskeletal:        General: No swelling.     Cervical back: Neck supple.  Skin:    General: Skin is warm and dry.     Capillary Refill: Capillary refill takes less than 2 seconds.  Neurological:     Mental Status: He is alert.  Psychiatric:        Mood and Affect: Mood normal.     (all labs ordered are listed, but only abnormal results are displayed) Labs Reviewed  URINALYSIS, ROUTINE W REFLEX MICROSCOPIC - Abnormal; Notable for the following components:      Result Value   Color, Urine STRAW (*)    All other components within normal limits  RAPID HIV SCREEN (HIV 1/2 AB+AG)  GC/CHLAMYDIA PROBE AMP ([place]) NOT AT  [facility]    EKG: None  Radiology: No results found.   Procedures   Medications Ordered in the ED  ketorolac  (TORADOL ) 15 MG/ML injection 15 mg (15 mg Intramuscular Given 10/23/24 0809)                                    Medical Decision Making Amount and/or Complexity of Data Reviewed Labs: ordered.  Risk Prescription drug management.     HPI:   Presents because of dental pain.  Present over the past day or 2.  Has had dental tractions in the past because of dental caries.  Patient states hurts left side of mouth and radiates up into his upper jaw.  No swelling.  No difficulty chewing.  No fever no chills.  No neck swelling or pain.  No headache.  No vision changes.  Also asking to be tested for STDs.  No recent sexual contacts.  No penile discharge.  No genital ulcers or bumps that he can appreciate.  Previous medical history reviewed : Last seen in the ED on September 26, 2024.  Was seen because of upper abdominal pain.  Negative workup at that time.  Presented to ED on September 13, 2024 because of dental pain as well  MDM:   Upon exam, patient hematin stable.  ANO x 2 GCS 15.  In terms of patient's dental pain.  Has not tried medication at home.  Patient has multiple dental caries throughout the mouth.  Pain left lower molars.  No obvious abscess.  No swelling.  Certainly no concerns for airway compromise.  No indication for imaging at this time.  No indication for lab work.  Will start patient on Augmentin .  Instructed follow-up with dentistry.  Strict return precautions for any kind of fever, chills, swelling.  Patient asking to be tested for STD.  No penile discharge.  No lesions of the penis.  No recent sexual contacts but asking for testing.  Will add on GC and chlamydia to urine.  No need to start prophylactic antibiotics given lack of symptoms as well as lack of exposure.  Discharged in stable addition    Upon reexamination, patient hemodynamically stable.  Remains  A&O x 3 with GCS 15.    Social Determinant of Health: Denies IV drugs         Final diagnoses:  Pain, dental    ED Discharge Orders          Ordered  amoxicillin -clavulanate (AUGMENTIN ) 875-125 MG tablet  Every 12 hours        10/23/24 0809               Simon Lavonia SAILOR, MD 10/23/24 [phone]

## 2024-10-23 NOTE — Discharge Instructions (Addendum)
 For pain, you can take 1000 mg of Tylenol  or 1 g of Tylenol  every 6-8 hours.  Do not exceed more than 4000 mg or 4 g in a 24-hour period.  You can also take ibuprofen  600 to 800 mg every 6-8 hours as well.  Do not take this high-dose ibuprofen  for greater than a week.   Take the augmentin  as prescribed.   Follow up with dentistry    If you have fevers/swelling come back to the ED.

## 2024-10-23 NOTE — Progress Notes (Signed)
 [facility]   Needs work note from ED visit- advised to call ED back given we did not see or treatment.  Patient acknowledged agreement and understanding of the plan.

## 2024-10-23 NOTE — ED Triage Notes (Signed)
 Pt c/o L side mouth pain w/ worsening bad breath smell. Pt also requesting to be tested for all STDs; denies having discharge or burning when peeing.

## 2024-10-24 LAB — GC/CHLAMYDIA PROBE AMP (~~LOC~~) NOT AT ARMC
Chlamydia: NEGATIVE
Comment: NEGATIVE
Comment: NORMAL
Neisseria Gonorrhea: NEGATIVE

## 2024-11-15 ENCOUNTER — Ambulatory Visit (HOSPITAL_COMMUNITY)
Admission: EM | Admit: 2024-11-15 | Discharge: 2024-11-15 | Disposition: A | Discharge status: Home / Self Care | Payer: MEDICAID | Attending: Physician Assistant | Admitting: Physician Assistant

## 2024-11-15 ENCOUNTER — Other Ambulatory Visit (HOSPITAL_COMMUNITY): Payer: Self-pay

## 2024-11-15 ENCOUNTER — Encounter (HOSPITAL_COMMUNITY): Payer: Self-pay

## 2024-11-15 DIAGNOSIS — K089 Disorder of teeth and supporting structures, unspecified: Secondary | ICD-10-CM

## 2024-11-15 DIAGNOSIS — G8929 Other chronic pain: Secondary | ICD-10-CM | POA: Diagnosis not present

## 2024-11-15 DIAGNOSIS — B349 Viral infection, unspecified: Secondary | ICD-10-CM | POA: Diagnosis not present

## 2024-11-15 DIAGNOSIS — M545 Low back pain, unspecified: Secondary | ICD-10-CM | POA: Diagnosis not present

## 2024-11-15 DIAGNOSIS — R051 Acute cough: Secondary | ICD-10-CM

## 2024-11-15 DIAGNOSIS — H66002 Acute suppurative otitis media without spontaneous rupture of ear drum, left ear: Secondary | ICD-10-CM

## 2024-11-15 LAB — POC SOFIA SARS ANTIGEN FIA: SARS Coronavirus 2 Ag: NEGATIVE

## 2024-11-15 LAB — POCT INFLUENZA A/B
Influenza A, POC: NEGATIVE
Influenza B, POC: NEGATIVE

## 2024-11-15 MED ORDER — CEFDINIR 300 MG PO CAPS
300.0000 mg | ORAL_CAPSULE | Freq: Two times a day (BID) | ORAL | 0 refills | Status: AC
  Filled 2024-11-15: qty 20, 10d supply, fill #0

## 2024-11-15 MED ORDER — METHOCARBAMOL 500 MG PO TABS
500.0000 mg | ORAL_TABLET | Freq: Two times a day (BID) | ORAL | 0 refills | Status: AC
  Filled 2024-11-15: qty 14, 7d supply, fill #0

## 2024-11-15 MED ORDER — LIDOCAINE VISCOUS HCL 2 % MT SOLN
15.0000 mL | OROMUCOSAL | 0 refills | Status: AC | PRN
  Filled 2024-11-15: qty 100, 7d supply, fill #0

## 2024-11-15 MED ORDER — IBUPROFEN 400 MG PO TABS
400.0000 mg | ORAL_TABLET | Freq: Three times a day (TID) | ORAL | 0 refills | Status: AC | PRN
  Filled 2024-11-15: qty 15, 5d supply, fill #0

## 2024-11-15 MED ORDER — BENZONATATE 100 MG PO CAPS
100.0000 mg | ORAL_CAPSULE | Freq: Three times a day (TID) | ORAL | 0 refills | Status: AC
  Filled 2024-11-15: qty 21, 7d supply, fill #0
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
## 2024-11-15 NOTE — ED Triage Notes (Addendum)
 Patient c/o left upper dental pain and states he has had pain since having his tooth extracted x 2 months ago. Patient also has has left facial swelling.  Patient c/o nasl congestion, a non productive cough, and a sore throat x 2 days.  Patient has had Tylenol  Mucinex, and off brand Day time and Night time cold tablets.   Patient added that he has had bilateral lower back pain x years that radiates into the mid back.

## 2024-11-15 NOTE — Discharge Instructions (Signed)
 You were negative for COVID and flu.  I suspect you have a different viral illness.  Use Tessalon for cough.  You can use over-the-counter medications versus Tylenol , Mucinex, nasal saline/sinus rinses.  You have an ear infection.  Start Omnicef twice daily for 10 days.  If you are not feeling better within a week or if anything worsens please return for reevaluation including chest pain, shortness of breath, worsening cough, weakness, nausea/vomiting interfere with oral intake.  Please follow-up with a dentist to soon as possible about the dental pain.  Use viscous lidocaine  up to twice a day.  This will crease your risk of choking so do not eat or drink immediately after using this medication.  If your pain worsens or if anything changes please return for reevaluation.  Start ibuprofen  for pain relief.  Do not take NSAIDs with this medication including aspirin, ibuprofen /Advil , naproxen /Aleve .  Take Robaxin  up to twice a day.  This will make you sleepy so do not drive or drink alcohol with taking it.  Follow-up with your primary care as you may benefit from physical therapy or seeing a specialist if your symptoms or not improving.  If anything worsens and you have difficulty using the bathroom or going to the bathroom on yourself without noticing it, weakness in your legs you need to be seen immediately.
